# Patient Record
Sex: Male | Born: 1960 | Race: White | Hispanic: No | Marital: Married | Smoking: Former smoker
Health system: Southern US, Community
[De-identification: ages and names within clinical notes are randomized; demographics above are authoritative.]

## PROBLEM LIST (undated history)

## (undated) DIAGNOSIS — I4891 Unspecified atrial fibrillation: Secondary | ICD-10-CM

## (undated) DIAGNOSIS — I700 Atherosclerosis of aorta: Secondary | ICD-10-CM

## (undated) DIAGNOSIS — Z720 Tobacco use: Secondary | ICD-10-CM

## (undated) HISTORY — DX: Tobacco use: Z72.0

## (undated) HISTORY — PX: GANGLION CYST EXCISION: SHX1691

## (undated) HISTORY — DX: Unspecified atrial fibrillation: I48.91

## (undated) HISTORY — DX: Atherosclerosis of aorta: I70.0

---

## 2004-02-10 ENCOUNTER — Emergency Department (HOSPITAL_COMMUNITY): Admission: EM | Admit: 2004-02-10 | Discharge: 2004-02-10 | Payer: Self-pay | Admitting: Emergency Medicine

## 2004-02-11 ENCOUNTER — Other Ambulatory Visit (HOSPITAL_COMMUNITY): Admission: RE | Admit: 2004-02-11 | Discharge: 2004-05-11 | Payer: Self-pay | Admitting: Psychiatry

## 2007-01-07 ENCOUNTER — Observation Stay (HOSPITAL_COMMUNITY): Admission: EM | Admit: 2007-01-07 | Discharge: 2007-01-08 | Payer: Self-pay | Admitting: Emergency Medicine

## 2010-11-15 NOTE — Discharge Summary (Signed)
NAME:  Kristopher [name], MCENROE              ACCOUNT NO.:  [id number]   MEDICAL RECORD NO.:  [id number]          PATIENT TYPE:  INP   LOCATION:  1417                         FACILITY:  [facility]   PHYSICIAN:  Francisca December, M.D.  DATE OF BIRTH:  04/28/1961   DATE OF ADMISSION:  01/07/2007  DATE OF DISCHARGE:                               DISCHARGE SUMMARY   DISCHARGE DIAGNOSES:  1. Chest pain, resolved.  2. Headache, resolved.  3. Family history of coronary artery disease.  4. History of cluster headaches.   Mr. [name] is a 50 year old male patient, who was admitted to [facility] after experiencing right neck, right head pain as well as  well right temporal region headache pain the evening prior to admission.  This happened twice during sexual intercourse.  The head discomfort  stopped.  The next day, he developed substernal chest pain that radiated  into the right neck region.  He came to the emergency room.   During his hospitalization, lab studies showed normal Point of Care  markers.  His CBC and B-Met were unremarkable.  His EKG had nonspecific  ST-T wave changes inferior laterally.   He did have a CT scan of the head that was negative.   After monitoring him overnight, we decided that he was ready for  discharge for home with plans to have a stress Cardiolite performed in  the office.  We will call him at home to arrange this.   He may return tomorrow on January 09, 2007, but he is instructed not to lift  over 10 pounds or take any strenuous activity until after Cardiolite has  been performed.  Our office will call him with an appointment for the  Cardiolite.   In the meantime, his medications are as follows:   1. Enteric-coated aspirin 325 mg a day.  2. Protonix 40 mg a day.  3. Metoprolol ER 25 mg a day.  4. Subinguinal nitroglycerin p.r.n. chest pain.   Patient is discharged to home in stable and improved condition.      Guy Franco, P.A.      Francisca December, M.D.  Electronically Signed    LB/MEDQ  D:  01/08/2007  T:  01/08/2007  Job:  [id number]

## 2010-11-15 NOTE — H&P (Addendum)
NAME:  Kristopher [name], Kristopher [name]              ACCOUNT NO.:  [id number]   MEDICAL RECORD NO.:  [id number]          PATIENT TYPE:  INP   LOCATION:  1417                         FACILITY:  [facility]   PHYSICIAN:  Francisca December, M.D.  DATE OF BIRTH:  1960/11/22   DATE OF ADMISSION:  01/07/2007  DATE OF DISCHARGE:                              HISTORY & PHYSICAL   CHIEF COMPLAINT:  Head, neck and chest pain.   Mr. Kristopher [name] is a 50 year old male patient with an unknown history of  coronary artery disease.  He developed right neck and right head pain  radiating into the right temporal region on the evening prior to  admission during sexual intercourse.  This recurred twice during the  same setting.  The discomfort essentially stopped, but he does have a  history of cluster headaches, which are similar.  On the date of  admission, he went to work and began having substernal chest pain that  radiated into his right neck.  He then came to the emergency room.  He  denies any other constitutional symptoms including palpitations,  dyspnea.   PAST MEDICAL HISTORY:  1. Smoker.  2. Cluster headaches.  3. Alcohol use.  4. Family history of coronary artery disease.   SOCIAL HISTORY:  Smokes 2 packs a day.  No illicit drug use.  Drinks 12-  24 beers weekly.   FAMILY HISTORY:  Mom died of a CVA.  Dad had his first heart attack in  his 61s.   He was on no medications.   He has no allergies.   VITALS:  Blood pressure 144/90, respirations 18, pulse 66, O2 saturation  is 97% on 2 liters.  HEENT:  Grossly normal.  No carotid upstroke ______, JVD, or  thyromegaly.  Clear conjunctivae.  Normal nares without drainage.  CHEST:  Clear to auscultation bilaterally.  No wheezes or rhonchi.  HEART:  Regular rate and rhythm.  No evidence of murmur.  ABDOMEN:  Benign.  ________ bowel sounds.  Nontender, nondistended.  No  masses.  No bruits.  LOWER EXTREMITIES:  No peripheral edema.  Palpable PT pulses  bilaterally.  SKIN:  Warm and dry.  He is alert and oriented x3.  Normal mood and affect.   LAB STUDIES:  Point of care markers negative x3.  Hemoglobin 16.6,  hematocrit 46.9, BUN 5, creatinine 0.77.  Chest x-ray:  Not acute.                                                 EKG:  Normal sinus rhythm.  Rate 74 with nonspecific ST-T wave changes  inferior laterally.   ASSESSMENT/PLAN:  1. Headache, now resolved.  2. Chest pain, pain-free.  3. Family history of coronary artery disease.  4. History of cluster headaches.  Head CT was checked and was normal.      We also will complete cardiac isoenzymes, check a TSH.  If all labs      are normal,  outpatient Cardiolite will be obtained.   Patient was seen and examined by Dr. Corliss Marcus.      Kristopher [name], P.A.      Francisca December, M.D.  Electronically Signed    LB/MEDQ  D:  01/08/2007  T:  01/08/2007  Job:  [id number]

## 2011-04-18 LAB — CBC
HCT: 46.9
Platelets: 348
WBC: 10

## 2011-04-18 LAB — COMPREHENSIVE METABOLIC PANEL
AST: 19
Albumin: 4.4
Alkaline Phosphatase: 79
Chloride: 104
GFR calc Af Amer: >60
Potassium: 4.6
Total Bilirubin: 0.8

## 2011-04-18 LAB — CARDIAC PANEL(CRET KIN+CKTOT+MB+TROPI)
Relative Index: INVALID
Troponin I: 0.01
Troponin I: 0.02

## 2011-04-18 LAB — CK TOTAL AND CKMB (NOT AT ARMC)
CK, MB: 0.9
Total CK: 36

## 2011-04-18 LAB — DIFFERENTIAL
Basophils Absolute: 0
Basophils Relative: 0
Eosinophils Relative: 1
Monocytes Absolute: 0.5

## 2011-04-18 LAB — POCT CARDIAC MARKERS
CKMB, poc: <1 — ABNORMAL LOW
Troponin i, poc: <0.05

## 2011-04-18 LAB — APTT: aPTT: 28

## 2016-07-29 ENCOUNTER — Other Ambulatory Visit: Payer: Self-pay | Admitting: Family

## 2016-07-29 ENCOUNTER — Ambulatory Visit (INDEPENDENT_AMBULATORY_CARE_PROVIDER_SITE_OTHER): Payer: Self-pay | Admitting: Family

## 2016-07-29 VITALS — BP 128/74 | HR 71 | Temp 99.5°F | Ht 71.5 in | Wt 139.4 lb

## 2016-07-29 DIAGNOSIS — Z0000 Encounter for general adult medical examination without abnormal findings: Secondary | ICD-10-CM

## 2016-07-29 DIAGNOSIS — Z1211 Encounter for screening for malignant neoplasm of colon: Secondary | ICD-10-CM

## 2016-07-29 DIAGNOSIS — Z720 Tobacco use: Secondary | ICD-10-CM

## 2016-07-29 DIAGNOSIS — Z125 Encounter for screening for malignant neoplasm of prostate: Secondary | ICD-10-CM

## 2016-07-29 NOTE — Patient Instructions (Signed)
Steps to Quit Smoking Smoking tobacco can be bad for your health. It can also affect almost every organ in your body. Smoking puts you and people around you at risk for many serious long-lasting (chronic) diseases. Quitting smoking is hard, but it is one of the best things that you can do for your health. It is never too late to quit. What are the benefits of quitting smoking? When you quit smoking, you lower your risk for getting serious diseases and conditions. They can include:  Lung cancer or lung disease.  Heart disease.  Stroke.  Heart attack.  Not being able to have children (infertility).  Weak bones (osteoporosis) and broken bones (fractures). If you have coughing, wheezing, and shortness of breath, those symptoms may get better when you quit. You may also get sick less often. If you are pregnant, quitting smoking can help to lower your chances of having a baby of low birth weight. What can I do to help me quit smoking? Talk with your doctor about what can help you quit smoking. Some things you can do (strategies) include:  Quitting smoking totally, instead of slowly cutting back how much you smoke over a period of time.  Going to in-person counseling. You are more likely to quit if you go to many counseling sessions.  Using resources and support systems, such as:  Online chats with a counselor.  Phone quitlines.  Printed self-help materials.  Support groups or group counseling.  Text messaging programs.  Mobile phone apps or applications.  Taking medicines. Some of these medicines may have nicotine in them. If you are pregnant or breastfeeding, do not take any medicines to quit smoking unless your doctor says it is okay. Talk with your doctor about counseling or other things that can help you. Talk with your doctor about using more than one strategy at the same time, such as taking medicines while you are also going to in-person counseling. This can help make quitting  easier. What things can I do to make it easier to quit? Quitting smoking might feel very hard at first, but there is a lot that you can do to make it easier. Take these steps:  Talk to your family and friends. Ask them to support and encourage you.  Call phone quitlines, reach out to support groups, or work with a counselor.  Ask people who smoke to not smoke around you.  Avoid places that make you want (trigger) to smoke, such as:  Bars.  Parties.  Smoke-break areas at work.  Spend time with people who do not smoke.  Lower the stress in your life. Stress can make you want to smoke. Try these things to help your stress:  Getting regular exercise.  Deep-breathing exercises.  Yoga.  Meditating.  Doing a body scan. To do this, close your eyes, focus on one area of your body at a time from head to toe, and notice which parts of your body are tense. Try to relax the muscles in those areas.  Download or buy apps on your mobile phone or tablet that can help you stick to your quit plan. There are many free apps, such as QuitGuide from the CDC (Centers for Disease Control and Prevention). You can find more support from smokefree.gov and other websites. This information is not intended to replace advice given to you by your health care provider. Make sure you discuss any questions you have with your health care provider. Document Released: 04/15/2009 Document Revised: 02/15/2016 Document   Reviewed: 11/03/2014 Elsevier Interactive Patient Education  2017 [employer]vier Inc.  

## 2016-07-29 NOTE — Progress Notes (Signed)
Subjective:     Patient ID: Kristopher [name], male   DOB: [birth year], 56 y.o.   MRN: [id number]  HPI Patient presents for yearly preventative medicine examination. He is a 56 year old white male, 1 ppd smoker. Never had a colonoscopy screen, last CXR 2006, and never had AAA screening.  All immunizations and health maintenance protocols were reviewed with the patient and needed orders were placed.Appropriate screening laboratory values were ordered for the patient including screening of hyperlipidemia, renal function and hepatic function.  PSA was ordered. Medication reconciliation,  past medical history, social history, problem list and allergies were reviewed in detail with the patient  Goals were established with regard to smoking cessation, exercise, and  diet in compliance with medications     Review of Systems  Constitutional: Negative.   HENT: Negative.   Eyes: Negative.   Respiratory: Negative.   Cardiovascular: Negative.   Gastrointestinal: Negative.   Endocrine: Negative.   Genitourinary: Negative.   Musculoskeletal: Negative.   Skin: Negative.   Allergic/Immunologic: Negative.   Neurological: Negative.   Hematological: Negative.   Psychiatric/Behavioral: Negative.    No past medical history on file.  Social History   Social History  . Marital status: Single    Spouse name: N/A  . Number of children: N/A  . Years of education: N/A   Occupational History  . Not on file.   Social History Main Topics  . Smoking status: Current Every Day Smoker    Packs/day: 1.00    Years: 40.00    Types: Cigarettes  . Smokeless tobacco: Never Used  . Alcohol use No  . Drug use: No  . Sexual activity: Not on file   Other Topics Concern  . Not on file   Social History Narrative  . No narrative on file    Past Surgical History:  Procedure Laterality Date  . GANGLION CYST EXCISION      Family History  Problem Relation Age of Onset  . Stroke Mother   . Heart disease  Father   . Heart attack Father   . Diabetes Sister   . Diabetes Brother     No Known Allergies  No current outpatient prescriptions on file prior to visit.   No current facility-administered medications on file prior to visit.     BP 128/74 (BP Location: Right Arm, Patient Position: Sitting, Cuff Size: Normal)   Pulse 71   Temp 99.5 F (37.5 C) (Oral)   Ht 5' 11.5" (1.816 m)   Wt 139 lb 6.4 oz (63.2 kg)   BMI 19.17 kg/m chart    Objective:   Physical Exam  Constitutional: He is oriented to person, place, and time. He appears well-developed and well-nourished.  HENT:  Head: Normocephalic and atraumatic.  Right Ear: External ear normal.  Left Ear: External ear normal.  Nose: Nose normal.  Mouth/Throat: Oropharynx is clear and moist.  Eyes: Conjunctivae and EOM are normal. Pupils are equal, round, and reactive to light.  Neck: Normal range of motion. Neck supple. No thyromegaly present.  Cardiovascular: Normal rate, regular rhythm and normal heart sounds.   Pulmonary/Chest: Effort normal and breath sounds normal.  Abdominal: Soft. Bowel sounds are normal.  Genitourinary: Rectum normal, prostate normal and penis normal.  Musculoskeletal: Normal range of motion.  Neurological: He is alert and oriented to person, place, and time. He has normal reflexes.  Skin: Skin is warm and dry.  Psychiatric: He has a normal mood and affect.  Assessment:     Kristopher [name] was seen today for work physical.  Diagnoses and all orders for this visit:  Encounter for wellness examination -     COMPLETE METABOLIC PANEL WITH GFR -     POCT urinalysis dipstick -     CBC with Differential -     Lipid Profile -     DG Chest 2 View; Future -     VAS US AAA DUPLEX; Future  Tobacco abuse disorder -     Lipid Profile -     DG Chest 2 View; Future -     VAS US AAA DUPLEX; Future  Screening for prostate cancer -     PSA, total and free  Special screening for malignant neoplasms, colon -      Ambulatory referral to Gastroenterology      Plan:     Patient advised to have colonoscopy screening, AAA screen, CXR. Labs will be [facility] Monday. Advised smoking cessation. Exercise 45 min 4 days a week. Call the office with any questions or concerns.

## 2016-07-31 ENCOUNTER — Ambulatory Visit
Admission: RE | Admit: 2016-07-31 | Discharge: 2016-07-31 | Disposition: A | Discharge status: Home / Self Care | Payer: 59 | Source: Ambulatory Visit | Attending: Family | Admitting: Family

## 2016-07-31 DIAGNOSIS — Z720 Tobacco use: Secondary | ICD-10-CM

## 2016-07-31 DIAGNOSIS — Z0000 Encounter for general adult medical examination without abnormal findings: Secondary | ICD-10-CM

## 2016-07-31 LAB — COMPLETE METABOLIC PANEL WITH GFR
ALT: 22 U/L (ref 9–46)
AST: 23 U/L (ref 10–35)
Albumin: 4.7 g/dL (ref 3.6–5.1)
Alkaline Phosphatase: 55 U/L (ref 40–115)
BUN: 6 mg/dL — ABNORMAL LOW (ref 7–25)
CALCIUM: 9.8 mg/dL (ref 8.6–10.3)
CHLORIDE: 103 mmol/L (ref 98–110)
CO2: 27 mmol/L (ref 20–31)
Creat: 0.69 mg/dL — ABNORMAL LOW (ref 0.70–1.33)
Glucose, Bld: 87 mg/dL (ref 65–99)
POTASSIUM: 4.9 mmol/L (ref 3.5–5.3)
Sodium: 139 mmol/L (ref 135–146)
Total Bilirubin: 0.5 mg/dL (ref 0.2–1.2)
Total Protein: 7.2 g/dL (ref 6.1–8.1)

## 2016-08-01 LAB — PSA: PSA: 0.9 ng/mL (ref <=4.0)

## 2016-08-01 LAB — LIPID PANEL
Cholesterol: 215 mg/dL — ABNORMAL HIGH (ref <200)
HDL: 106 mg/dL (ref >40)
LDL CALC: 98 mg/dL (ref <100)
Total CHOL/HDL Ratio: 2 Ratio (ref <5.0)
Triglycerides: 56 mg/dL (ref <150)
VLDL: 11 mg/dL (ref <30)

## 2016-08-30 ENCOUNTER — Encounter: Payer: Self-pay | Admitting: Family

## 2018-07-30 IMAGING — CR DG CHEST 2V
2 series · 2 of 2 positions shown · non-contrast
Comparison: 01/07/2007.

CLINICAL DATA: Wellness exam.

EXAM:
CHEST  2 VIEW

[w chest pa]
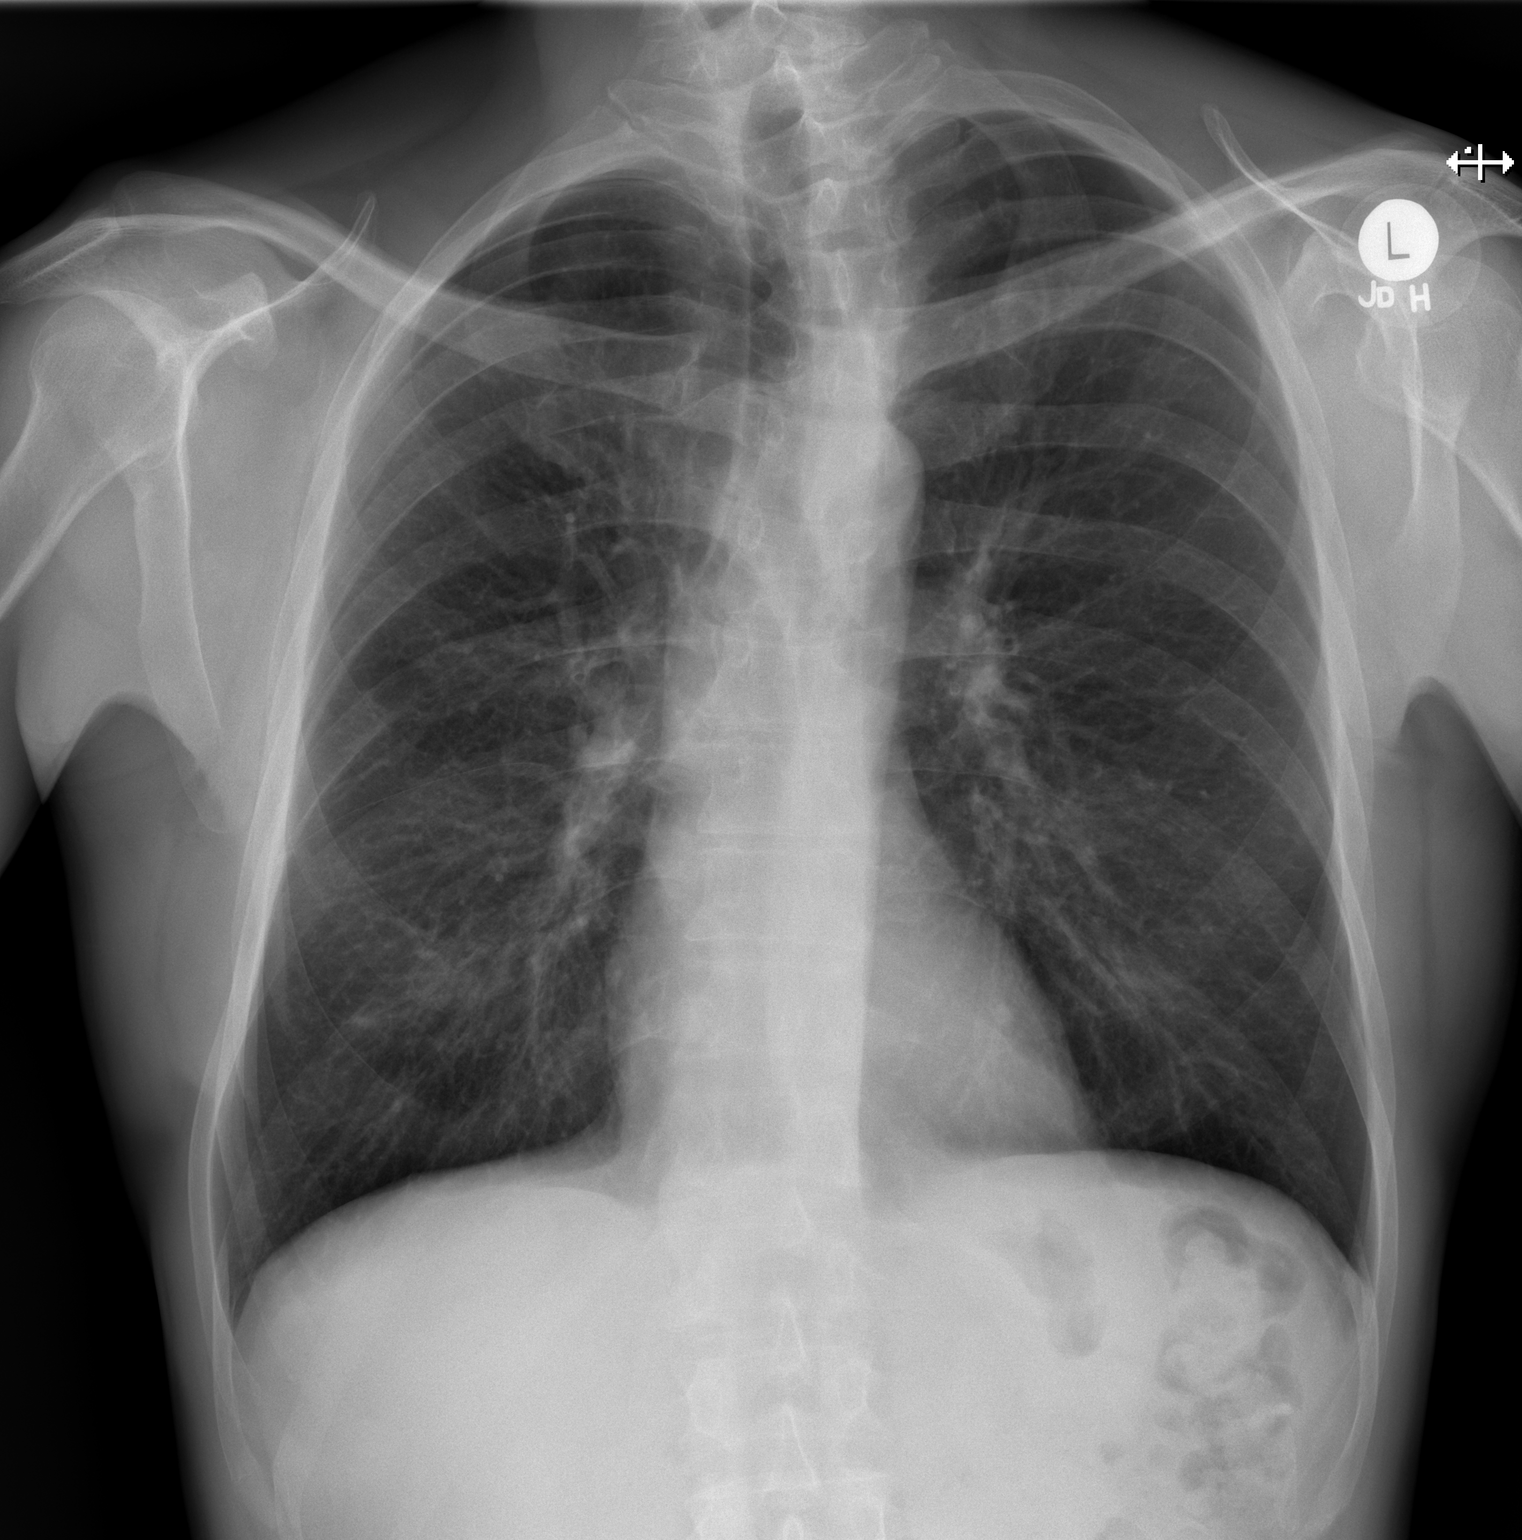

[w chest lat]
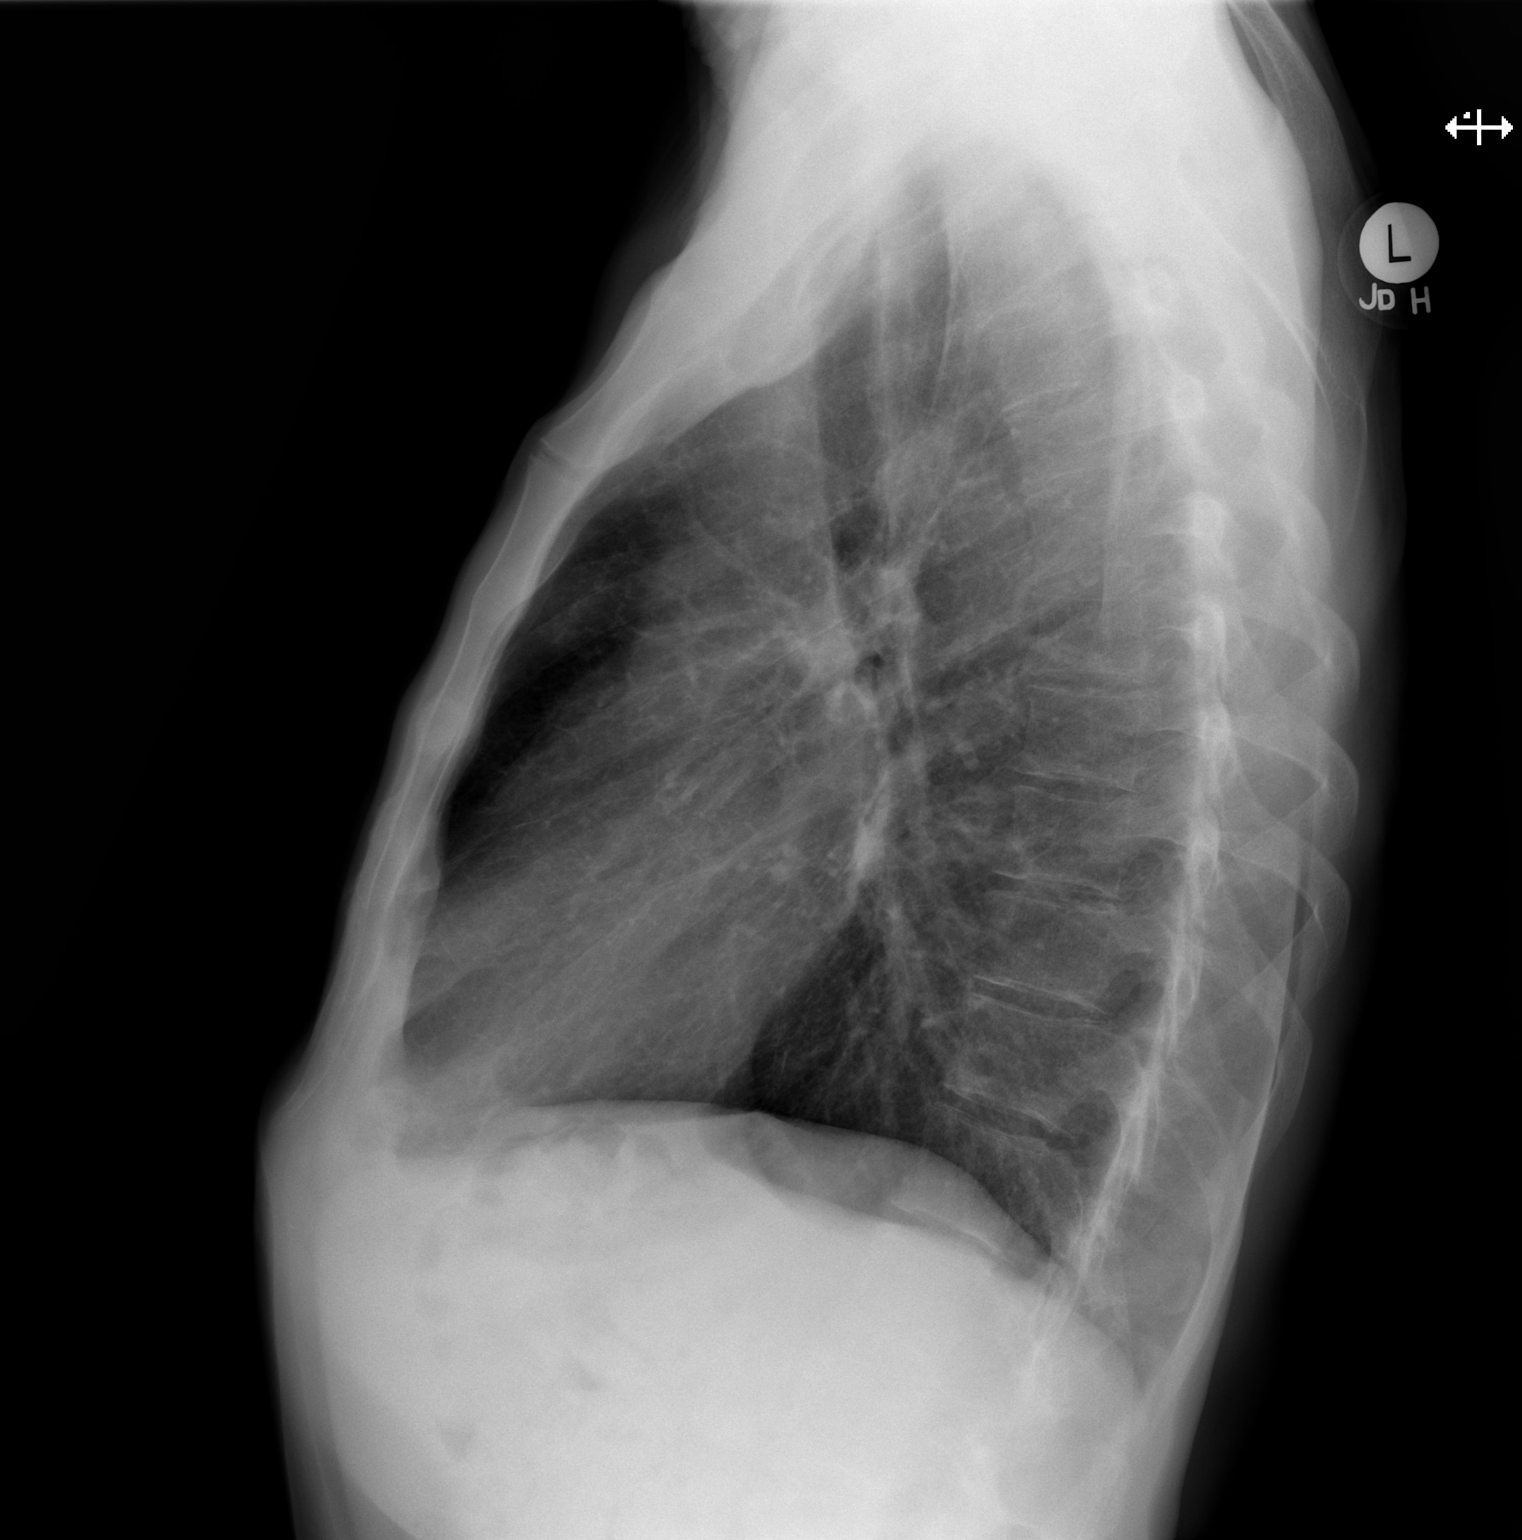

[2 of 2 positions shown; findings below may reference images not displayed]

FINDINGS: Mediastinum hilar structures are unremarkable. Lungs are clear. No
pleural effusion pneumothorax. Stable mild thoracic spine scoliosis.
No acute bony abnormality.
IMPRESSION: No acute cardiopulmonary disease.

## 2023-03-14 ENCOUNTER — Emergency Department (HOSPITAL_COMMUNITY): Payer: 59

## 2023-03-14 ENCOUNTER — Encounter (HOSPITAL_COMMUNITY): Payer: Self-pay

## 2023-03-14 ENCOUNTER — Other Ambulatory Visit: Payer: Self-pay

## 2023-03-14 ENCOUNTER — Inpatient Hospital Stay (HOSPITAL_COMMUNITY)
Admission: EM | Admit: 2023-03-14 | Discharge: 2023-03-19 | DRG: 183 | Disposition: A | Discharge status: Home / Self Care | Payer: 59 | Attending: Internal Medicine | Admitting: Internal Medicine

## 2023-03-14 DIAGNOSIS — S2249XA Multiple fractures of ribs, unspecified side, initial encounter for closed fracture: Secondary | ICD-10-CM | POA: Diagnosis not present

## 2023-03-14 DIAGNOSIS — T797XXA Traumatic subcutaneous emphysema, initial encounter: Secondary | ICD-10-CM | POA: Diagnosis not present

## 2023-03-14 DIAGNOSIS — F17210 Nicotine dependence, cigarettes, uncomplicated: Secondary | ICD-10-CM

## 2023-03-14 DIAGNOSIS — Z9181 History of falling: Secondary | ICD-10-CM | POA: Diagnosis not present

## 2023-03-14 DIAGNOSIS — R9431 Abnormal electrocardiogram [ECG] [EKG]: Secondary | ICD-10-CM

## 2023-03-14 DIAGNOSIS — S2242XK Multiple fractures of ribs, left side, subsequent encounter for fracture with nonunion: Secondary | ICD-10-CM | POA: Diagnosis present

## 2023-03-14 DIAGNOSIS — S79912A Unspecified injury of left hip, initial encounter: Secondary | ICD-10-CM | POA: Diagnosis not present

## 2023-03-14 DIAGNOSIS — S270XXA Traumatic pneumothorax, initial encounter: Secondary | ICD-10-CM | POA: Diagnosis not present

## 2023-03-14 DIAGNOSIS — S2242XA Multiple fractures of ribs, left side, initial encounter for closed fracture: Secondary | ICD-10-CM | POA: Diagnosis not present

## 2023-03-14 DIAGNOSIS — R0602 Shortness of breath: Secondary | ICD-10-CM | POA: Diagnosis not present

## 2023-03-14 DIAGNOSIS — J939 Pneumothorax, unspecified: Principal | ICD-10-CM | POA: Diagnosis present

## 2023-03-14 DIAGNOSIS — D72829 Elevated white blood cell count, unspecified: Secondary | ICD-10-CM | POA: Diagnosis not present

## 2023-03-14 DIAGNOSIS — I480 Paroxysmal atrial fibrillation: Secondary | ICD-10-CM | POA: Diagnosis not present

## 2023-03-14 DIAGNOSIS — M25552 Pain in left hip: Secondary | ICD-10-CM | POA: Diagnosis not present

## 2023-03-14 DIAGNOSIS — I4891 Unspecified atrial fibrillation: Secondary | ICD-10-CM | POA: Diagnosis not present

## 2023-03-14 DIAGNOSIS — I4819 Other persistent atrial fibrillation: Secondary | ICD-10-CM | POA: Diagnosis not present

## 2023-03-14 DIAGNOSIS — W19XXXA Unspecified fall, initial encounter: Secondary | ICD-10-CM

## 2023-03-14 DIAGNOSIS — R935 Abnormal findings on diagnostic imaging of other abdominal regions, including retroperitoneum: Secondary | ICD-10-CM | POA: Diagnosis not present

## 2023-03-14 DIAGNOSIS — Z8249 Family history of ischemic heart disease and other diseases of the circulatory system: Secondary | ICD-10-CM

## 2023-03-14 DIAGNOSIS — S27321A Contusion of lung, unilateral, initial encounter: Secondary | ICD-10-CM | POA: Diagnosis present

## 2023-03-14 DIAGNOSIS — R918 Other nonspecific abnormal finding of lung field: Secondary | ICD-10-CM | POA: Diagnosis not present

## 2023-03-14 DIAGNOSIS — D751 Secondary polycythemia: Secondary | ICD-10-CM | POA: Diagnosis not present

## 2023-03-14 DIAGNOSIS — D7589 Other specified diseases of blood and blood-forming organs: Secondary | ICD-10-CM | POA: Diagnosis not present

## 2023-03-14 DIAGNOSIS — J9601 Acute respiratory failure with hypoxia: Secondary | ICD-10-CM | POA: Diagnosis present

## 2023-03-14 DIAGNOSIS — E7800 Pure hypercholesterolemia, unspecified: Secondary | ICD-10-CM | POA: Diagnosis present

## 2023-03-14 DIAGNOSIS — I700 Atherosclerosis of aorta: Secondary | ICD-10-CM | POA: Diagnosis not present

## 2023-03-14 DIAGNOSIS — W11XXXA Fall on and from ladder, initial encounter: Secondary | ICD-10-CM | POA: Diagnosis present

## 2023-03-14 DIAGNOSIS — Z716 Tobacco abuse counseling: Secondary | ICD-10-CM

## 2023-03-14 DIAGNOSIS — I340 Nonrheumatic mitral (valve) insufficiency: Secondary | ICD-10-CM | POA: Diagnosis not present

## 2023-03-14 DIAGNOSIS — J811 Chronic pulmonary edema: Secondary | ICD-10-CM | POA: Diagnosis not present

## 2023-03-14 DIAGNOSIS — K5730 Diverticulosis of large intestine without perforation or abscess without bleeding: Secondary | ICD-10-CM | POA: Diagnosis not present

## 2023-03-14 DIAGNOSIS — J9811 Atelectasis: Secondary | ICD-10-CM | POA: Diagnosis not present

## 2023-03-14 LAB — I-STAT CHEM 8, ED
BUN: 15 mg/dL (ref 8–23)
Calcium, Ion: 1.2 mmol/L (ref 1.15–1.40)
Chloride: 103 mmol/L (ref 98–111)
Creatinine, Ser: 0.8 mg/dL (ref 0.61–1.24)
Glucose, Bld: 103 mg/dL — ABNORMAL HIGH (ref 70–99)
HCT: 51 % (ref 39.0–52.0)
Hemoglobin: 17.3 g/dL — ABNORMAL HIGH (ref 13.0–17.0)
Potassium: 4.6 mmol/L (ref 3.5–5.1)
Sodium: 138 mmol/L (ref 135–145)
TCO2: 25 mmol/L (ref 22–32)

## 2023-03-14 LAB — COMPREHENSIVE METABOLIC PANEL
ALT: 28 U/L (ref 0–44)
AST: 31 U/L (ref 15–41)
Albumin: 4.7 g/dL (ref 3.5–5.0)
Alkaline Phosphatase: 83 U/L (ref 38–126)
Anion gap: 11 (ref 5–15)
BUN: 15 mg/dL (ref 8–23)
CO2: 25 mmol/L (ref 22–32)
Calcium: 9.7 mg/dL (ref 8.9–10.3)
Chloride: 100 mmol/L (ref 98–111)
Creatinine, Ser: 0.97 mg/dL (ref 0.61–1.24)
GFR, Estimated: >60 mL/min (ref >60)
Glucose, Bld: 109 mg/dL — ABNORMAL HIGH (ref 70–99)
Potassium: 5 mmol/L (ref 3.5–5.1)
Sodium: 136 mmol/L (ref 135–145)
Total Bilirubin: 1 mg/dL (ref 0.3–1.2)
Total Protein: 7.9 g/dL (ref 6.5–8.1)

## 2023-03-14 LAB — CBC
HCT: 52.6 % — ABNORMAL HIGH (ref 39.0–52.0)
Hemoglobin: 18 g/dL — ABNORMAL HIGH (ref 13.0–17.0)
MCH: 34.7 pg — ABNORMAL HIGH (ref 26.0–34.0)
MCHC: 34.2 g/dL (ref 30.0–36.0)
MCV: 101.5 fL — ABNORMAL HIGH (ref 80.0–100.0)
Platelets: 312 10*3/uL (ref 150–400)
RBC: 5.18 MIL/uL (ref 4.22–5.81)
RDW: 11.9 % (ref 11.5–15.5)
WBC: 14.1 10*3/uL — ABNORMAL HIGH (ref 4.0–10.5)
nRBC: 0 % (ref 0.0–0.2)

## 2023-03-14 MED ORDER — SIMETHICONE 40 MG/0.6ML PO SUSP: 80.0000 mg | Freq: Four times a day (QID) | ORAL | Status: DC | PRN

## 2023-03-14 MED ORDER — LACTATED RINGERS IV BOLUS
500.0000 mL | Freq: Once | INTRAVENOUS | Status: AC
  Administered 2023-03-14: 500 mL via INTRAVENOUS

## 2023-03-14 MED ORDER — PSYLLIUM 95 % PO PACK
1.0000 | PACK | Freq: Two times a day (BID) | ORAL | Status: DC
  Administered 2023-03-15 – 2023-03-18 (×7): 1 via ORAL
  Filled 2023-03-14 (×11): qty 1

## 2023-03-14 MED ORDER — ALUM & MAG HYDROXIDE-SIMETH 200-200-20 MG/5ML PO SUSP: 30.0000 mL | Freq: Four times a day (QID) | ORAL | Status: DC | PRN

## 2023-03-14 MED ORDER — HYDROMORPHONE HCL 1 MG/ML IJ SOLN
1.0000 mg | Freq: Once | INTRAMUSCULAR | Status: AC
  Administered 2023-03-14: 1 mg via INTRAVENOUS
  Filled 2023-03-14: qty 1

## 2023-03-14 MED ORDER — ACETAMINOPHEN 500 MG PO TABS
1000.0000 mg | ORAL_TABLET | Freq: Four times a day (QID) | ORAL | Status: DC
  Administered 2023-03-15 – 2023-03-18 (×9): 1000 mg via ORAL
  Filled 2023-03-14 (×14): qty 2

## 2023-03-14 MED ORDER — IOHEXOL 300 MG/ML  SOLN
100.0000 mL | Freq: Once | INTRAMUSCULAR | Status: AC | PRN
  Administered 2023-03-14: 100 mL via INTRAVENOUS

## 2023-03-14 MED ORDER — ALBUTEROL SULFATE (2.5 MG/3ML) 0.083% IN NEBU: 2.5000 mg | INHALATION_SOLUTION | Freq: Four times a day (QID) | RESPIRATORY_TRACT | Status: DC | PRN

## 2023-03-14 MED ORDER — NAPHAZOLINE-GLYCERIN 0.012-0.25 % OP SOLN: 1.0000 [drp] | Freq: Four times a day (QID) | OPHTHALMIC | Status: DC | PRN

## 2023-03-14 MED ORDER — GUAIFENESIN-DM 100-10 MG/5ML PO SYRP: 15.0000 mL | ORAL_SOLUTION | ORAL | Status: DC | PRN

## 2023-03-14 MED ORDER — MORPHINE SULFATE (PF) 4 MG/ML IV SOLN
4.0000 mg | Freq: Once | INTRAVENOUS | Status: AC
  Administered 2023-03-14: 4 mg via INTRAVENOUS
  Filled 2023-03-14: qty 1

## 2023-03-14 MED ORDER — LACTATED RINGERS IV BOLUS: 1000.0000 mL | Freq: Three times a day (TID) | INTRAVENOUS | Status: DC | PRN

## 2023-03-14 MED ORDER — METHOCARBAMOL 500 MG PO TABS: 1000.0000 mg | ORAL_TABLET | Freq: Four times a day (QID) | ORAL | Status: DC | PRN

## 2023-03-14 MED ORDER — HYDROMORPHONE HCL 1 MG/ML IJ SOLN: 0.5000 mg | INTRAMUSCULAR | Status: DC | PRN

## 2023-03-14 MED ORDER — MAGIC MOUTHWASH: 15.0000 mL | Freq: Four times a day (QID) | ORAL | Status: DC | PRN

## 2023-03-14 MED ORDER — SALINE SPRAY 0.65 % NA SOLN: 1.0000 | Freq: Four times a day (QID) | NASAL | Status: DC | PRN

## 2023-03-14 MED ORDER — ONDANSETRON HCL 4 MG/2ML IJ SOLN
4.0000 mg | Freq: Once | INTRAMUSCULAR | Status: AC
  Administered 2023-03-14: 4 mg via INTRAVENOUS
  Filled 2023-03-14: qty 2

## 2023-03-14 MED ORDER — MENTHOL 3 MG MT LOZG: 1.0000 | LOZENGE | OROMUCOSAL | Status: DC | PRN

## 2023-03-14 MED ORDER — LORAZEPAM 2 MG/ML IJ SOLN
0.5000 mg | Freq: Once | INTRAMUSCULAR | Status: AC
  Administered 2023-03-14: 0.5 mg via INTRAVENOUS
  Filled 2023-03-14: qty 1

## 2023-03-14 MED ORDER — METOPROLOL TARTRATE 5 MG/5ML IV SOLN
5.0000 mg | INTRAVENOUS | Status: AC | PRN
  Administered 2023-03-14 (×3): 5 mg via INTRAVENOUS
  Filled 2023-03-14 (×3): qty 5

## 2023-03-14 MED ORDER — LACTATED RINGERS IV BOLUS
1000.0000 mL | Freq: Once | INTRAVENOUS | Status: AC
  Administered 2023-03-14: 1000 mL via INTRAVENOUS

## 2023-03-14 MED ORDER — METHOCARBAMOL 1000 MG/10ML IJ SOLN: 1000.0000 mg | Freq: Four times a day (QID) | INTRAVENOUS | Status: DC | PRN

## 2023-03-14 MED ORDER — SODIUM CHLORIDE 0.9 % IV SOLN: 8.0000 mg | Freq: Four times a day (QID) | INTRAVENOUS | Status: DC | PRN

## 2023-03-14 MED ORDER — PHENOL 1.4 % MT LIQD: 2.0000 | OROMUCOSAL | Status: DC | PRN

## 2023-03-14 MED ORDER — BISACODYL 10 MG RE SUPP: 10.0000 mg | Freq: Two times a day (BID) | RECTAL | Status: DC | PRN

## 2023-03-14 MED ORDER — DILTIAZEM HCL-DEXTROSE 125-5 MG/125ML-% IV SOLN (PREMIX)
5.0000 mg/h | INTRAVENOUS | Status: DC
  Administered 2023-03-14 – 2023-03-16 (×2): 5 mg/h via INTRAVENOUS
  Filled 2023-03-14 (×3): qty 125

## 2023-03-14 MED ORDER — NICOTINE 14 MG/24HR TD PT24
14.0000 mg | MEDICATED_PATCH | Freq: Every day | TRANSDERMAL | Status: DC
  Administered 2023-03-14 – 2023-03-19 (×6): 14 mg via TRANSDERMAL
  Filled 2023-03-14 (×6): qty 1

## 2023-03-14 MED ORDER — ONDANSETRON HCL 4 MG/2ML IJ SOLN: 4.0000 mg | Freq: Four times a day (QID) | INTRAMUSCULAR | Status: DC | PRN

## 2023-03-14 MED ORDER — OXYCODONE HCL 5 MG PO TABS: 5.0000 mg | ORAL_TABLET | ORAL | Status: DC | PRN

## 2023-03-14 NOTE — H&P (Incomplete)
History and Physical    Kristopher [name] XBM:[id number] DOB: [birth year] DOA: 03/14/2023  PCP: Patient, No Pcp Per  Patient coming from: Home  Chief Complaint: Fall  HPI: Kristopher [name] is a 62 y.o. male with medical history significant of cigarette smoking presented to the ED after a fall from a ladder which was approximately 6 feet above the ground.  Patient complained of left-sided rib pain and left hip pain.  Not on anticoagulation.  In the ED, patient found to be in new onset A-fib with RVR with rate initially in the 140s.  Afebrile. Labs showing WBC 14.1, hemoglobin 18.0. CT chest abdomen pelvis showing mildly displaced left seventh, eighth, and ninth rib fractures with small left-sided pneumothorax.  No definite traumatic injury in the abdomen or pelvis. X-ray of left hip/pelvis negative for fracture or dislocation.  Medications administered in the ED include Dilaudid, morphine, Zofran, nicotine patch, IV metoprolol 5 mg, and 1 L LR.  Continued to be in A-fib with rate in the 130s and subsequently started on Cardizem drip.  Oxygen saturation dropped to the high 70s on room air, subsequently placed on 3 L [place].  Trauma surgery consulted and requested admission at [facility] by medicine service.  [facility] called to admit.  Patient states he was at his son's house and fell off from a ladder from approximately 6 feet above the ground.  He reports falling on his left side and injuring his ribs on the left.  Denies head injury or loss of consciousness.  He smokes 1.5 packs of cigarettes daily and has been smoking cigarettes for over 50 years.  Patient states about 10 years ago he was seen by a cardiologist in [place] and was told that he had A-fib.  Patient states he had an echocardiogram done at that time and he was told that it was showing "regurgitation" from a heart valve but it was mild and did not require any further interventions.  States he was never started on a blood thinner or any  other medications for A-fib.  He denies history of any other medical problems.  Denies shortness of breath.  No other complaints.  Review of Systems:  Review of Systems  All other systems reviewed and are negative.   History reviewed. No pertinent past medical history.  Past Surgical History:  Procedure Laterality Date  . GANGLION CYST EXCISION       reports that he has been smoking cigarettes. He has a 40 pack-year smoking history. He has never used smokeless tobacco. He reports that he does not drink alcohol and does not use drugs.  No Known Allergies  Family History  Problem Relation Age of Onset  . Stroke Mother   . Heart disease Father   . Heart attack Father   . Diabetes Sister   . Diabetes Brother     Prior to Admission medications   Medication Sig Start Date End Date Taking? Authorizing Provider  nicotine polacrilex (NICORETTE) 4 MG gum Take 4 mg by mouth as needed for smoking cessation.   Yes [provider]    Physical Exam: Vitals:   03/14/23 2215 03/14/23 2230 03/14/23 2245 03/14/23 2300  BP:  126/86  110/79  Pulse: 95 (!) 52 91 94  Resp:  19 (!) 24 13  Temp:      TempSrc:      SpO2: 90% (!) 79% (!) 86% 97%  Weight:      Height:  Physical Exam Vitals reviewed.  Constitutional:      General: He is not in acute distress. HENT:     Head: Normocephalic and atraumatic.  Eyes:     Extraocular Movements: Extraocular movements intact.  Cardiovascular:     Rate and Rhythm: Normal rate and regular rhythm.     Pulses: Normal pulses.  Pulmonary:     Effort: Pulmonary effort is normal. No respiratory distress.     Breath sounds: Normal breath sounds. No wheezing or rales.  Abdominal:     General: Bowel sounds are normal. There is no distension.     Palpations: Abdomen is soft.     Tenderness: There is no abdominal tenderness. There is no guarding.  Musculoskeletal:     Cervical back: Normal range of motion.     Right lower leg: No edema.      Left lower leg: No edema.  Skin:    General: Skin is warm and dry.  Neurological:     General: No focal deficit present.     Mental Status: He is alert and oriented to person, place, and time.     Labs on Admission: I have personally reviewed following labs and imaging studies  CBC: Recent Labs  Lab 03/14/23 1439 03/14/23 1625  WBC 14.1*  --   HGB 18.0* 17.3*  HCT 52.6* 51.0  MCV 101.5*  --   PLT 312  --    Basic Metabolic Panel: Recent Labs  Lab 03/14/23 1625 03/14/23 1653  NA 138 136  K 4.6 5.0  CL 103 100  CO2  --  25  GLUCOSE 103* 109*  BUN 15 15  CREATININE 0.80 0.97  CALCIUM  --  9.7   GFR: Estimated Creatinine Clearance: 68.5 mL/min (by C-G formula based on SCr of 0.97 mg/dL). Liver Function Tests: Recent Labs  Lab 03/14/23 1653  AST 31  ALT 28  ALKPHOS 83  BILITOT 1.0  PROT 7.9  ALBUMIN 4.7   No results for input(s): "LIPASE", "AMYLASE" in the last 168 hours. No results for input(s): "AMMONIA" in the last 168 hours. Coagulation Profile: No results for input(s): "INR", "PROTIME" in the last 168 hours. Cardiac Enzymes: No results for input(s): "CKTOTAL", "CKMB", "CKMBINDEX", "TROPONINI" in the last 168 hours. BNP (last 3 results) No results for input(s): "PROBNP" in the last 8760 hours. HbA1C: No results for input(s): "HGBA1C" in the last 72 hours. CBG: No results for input(s): "GLUCAP" in the last 168 hours. Lipid Profile: No results for input(s): "CHOL", "HDL", "LDLCALC", "TRIG", "CHOLHDL", "LDLDIRECT" in the last 72 hours. Thyroid Function Tests: No results for input(s): "TSH", "T4TOTAL", "FREET4", "T3FREE", "THYROIDAB" in the last 72 hours. Anemia Panel: No results for input(s): "VITAMINB12", "FOLATE", "FERRITIN", "TIBC", "IRON", "RETICCTPCT" in the last 72 hours. Urine analysis: No results found for: "COLORURINE", "APPEARANCEUR", "LABSPEC", "PHURINE", "GLUCOSEU", "HGBUR", "BILIRUBINUR", "KETONESUR", "PROTEINUR", "UROBILINOGEN",  "NITRITE", "LEUKOCYTESUR"  Radiological Exams on Admission: DG Chest 2 View  Result Date: 03/14/2023 CLINICAL DATA:  Larey Seat off ladder pneumothorax EXAM: CHEST - 2 VIEW COMPARISON:  CT 03/14/2023, chest x-ray 03/14/2023 FINDINGS: Negative for pleural effusion. Small left apical pneumothorax demonstrating about 2.8 cm pleural-parenchymal separation at the apex compared with 1.8 cm on prior radiograph. No midline shift. Acute displaced left seventh and eighth and ninth rib fractures. IMPRESSION: 1. Small left apical pneumothorax, slightly increased in size compared to prior radiograph. 2. Acute displaced left seventh, eighth, and ninth rib fractures. Electronically Signed   By: Jasmine Pang M.D.   On:  03/14/2023 22:17   CT CHEST ABDOMEN PELVIS W CONTRAST  Result Date: 03/14/2023 CLINICAL DATA:  Fall off ladder. EXAM: CT CHEST, ABDOMEN, AND PELVIS WITH CONTRAST TECHNIQUE: Multidetector CT imaging of the chest, abdomen and pelvis was performed following the standard protocol during bolus administration of intravenous contrast. RADIATION DOSE REDUCTION: This exam was performed according to the departmental dose-optimization program which includes automated exposure control, adjustment of the mA and/or kV according to patient size and/or use of iterative reconstruction technique. CONTRAST:  OMNIPAQUE IOHEXOL 300 MG/ML  SOLN COMPARISON:  None Available. FINDINGS: CT CHEST FINDINGS Cardiovascular: No significant vascular findings. Normal heart size. No pericardial effusion. Mediastinum/Nodes: No enlarged mediastinal, hilar, or axillary lymph nodes. Thyroid gland, trachea, and esophagus demonstrate no significant findings. Lungs/Pleura: Small left apical and anterior pneumothorax is noted as described on prior radiograph. No effusion is noted. No consolidation is noted. Musculoskeletal: Mildly displaced fractures are seen involving the lateral portions of the left seventh, eighth and ninth ribs. CT ABDOMEN  PELVIS FINDINGS Hepatobiliary: No cholelithiasis or biliary dilatation is noted. Multiple small calcifications are seen throughout hepatic parenchyma suggesting old inflammation. Pancreas: Unremarkable. No pancreatic ductal dilatation or surrounding inflammatory changes. Spleen: Multiple calcified granulomas are noted. Adrenals/Urinary Tract: Adrenal glands are unremarkable. Kidneys are normal, without renal calculi, focal lesion, or hydronephrosis. Bladder is unremarkable. Stomach/Bowel: Stomach is within normal limits. Appendix appears normal. No evidence of bowel wall thickening, distention, or inflammatory changes. Sigmoid diverticulosis is noted without inflammation. Vascular/Lymphatic: Aortic atherosclerosis. No enlarged abdominal or pelvic lymph nodes. Reproductive: Prostate is unremarkable. Other: No abdominal wall hernia or abnormality. No abdominopelvic ascites. Musculoskeletal: No acute or significant osseous findings. IMPRESSION: Small left-sided pneumothorax is noted as described on prior chest radiograph. Mildly displaced left seventh, eighth and ninth rib fractures. No definite traumatic injury seen in the abdomen or pelvis. Sigmoid diverticulosis without inflammation. Aortic Atherosclerosis (ICD10-I70.0). Electronically Signed   By: Lupita Raider M.D.   On: 03/14/2023 18:38   DG Chest Portable 1 View  Result Date: 03/14/2023 CLINICAL DATA:  Left rib pain after fall off ladder. EXAM: PORTABLE CHEST 1 VIEW COMPARISON:  July 31, 2016. FINDINGS: The heart size and mediastinal contours are within normal limits. Small left apical pneumothorax is noted. Mildly displaced left seventh and eighth rib fractures are noted. No consolidative process is noted. IMPRESSION: Mildly displaced left seventh and eighth rib fractures. Small left apical pneumothorax. Critical Value/emergent results were called by telephone at the time of interpretation on 03/14/2023 at 4:27 pm to provider Dr. Jearld Fenton, who verbally  acknowledged these results. Electronically Signed   By: Lupita Raider M.D.   On: 03/14/2023 16:27   DG Hip Unilat W or Wo Pelvis 2-3 Views Left  Result Date: 03/14/2023 CLINICAL DATA:  Fall off ladder. EXAM: DG HIP (WITH OR WITHOUT PELVIS) 2-3V LEFT COMPARISON:  None Available. FINDINGS: There is no evidence of hip fracture or dislocation. There is no evidence of arthropathy or other focal bone abnormality. IMPRESSION: Negative. Electronically Signed   By: Lupita Raider M.D.   On: 03/14/2023 16:22    EKG: Independently reviewed. A-fib with RVR, QTc 503.  No previous EKG for comparison.  Assessment and Plan  Mildly displaced left seventh, eighth, and ninth rib fractures with small left-sided pneumothorax Hypoxia In the setting of fall from a ladder which was approximately 6 feet above the ground.  Continue pain management with scheduled Tylenol, IV Dilaudid PRN, Oxy IR PRN, and Robaxin PRN. Incentive spirometry to  prevent atelectasis.  Oxygen saturation dropped to the high 80s on room air in the ED, currently satting well on 3 L [place].  Continuous pulse ox.  Repeat chest x-ray in the morning to make sure there is no progression of pneumothorax.  If there is progression of pneumothorax, then may benefit from percutaneous thoracostomy tube placement.  Trauma surgery following, appreciate recommendations.  A-fib with RVR Rate remained elevated despite IV metoprolol 5 mg, subsequently started on Cardizem drip in the ED.  Blood pressure stable.  CHA2DS2-VASc score 0.  Continue IV fluid hydration and pain management.  Echocardiogram ordered.  Check TSH and magnesium level.  QT prolongation Keep K >4 and Mag >2.  Avoid QT prolonging drugs.  Mild leukocytosis No fever or infectious signs or symptoms.  Repeat CBC in a.m.  Erythrocytosis Repeat CBC in a.m. to confirm.  Cigarette smoking NicoDerm patch and continue to counsel to quit.  DVT prophylaxis: SCDs Code Status: Full Code (discussed with  the patient) Consults called: Trauma surgery Level of care: Progressive Care Unit Admission status: It is my clinical opinion that admission to INPATIENT is reasonable and necessary because of the expectation that this patient will require hospital care that crosses at least 2 midnights to treat this condition based on the medical complexity of the problems presented.  Given the aforementioned information, the predictability of an adverse outcome is felt to be significant.  John Giovanni MD [facility]  If 7PM-7AM, please contact night-coverage [email or web address]  03/14/2023, 11:24 PM

## 2023-03-14 NOTE — ED Provider Notes (Signed)
[place] EMERGENCY DEPARTMENT AT [facility] Provider Note   CSN: [id number] Arrival date & time: 03/14/23  1330     History  Chief Complaint  Patient presents with   Kristopher [name] is a 62 y.o. male.   Fall     Patient presents to the ER for evaluation after a fall.  Patient states he was standing on a ladder when he fell proximately 6 feet off the ground onto his left ribs and his left hip.  Patient states he is felt a popping in his left rib area since the fall.  It hurts when he takes a deep breath.  He is not feeling short of breath though.  The pain is severe especially when he moves.  He denies any abdominal pain.  No vomiting.  Patient is not on any anticoagulants.  Home Medications Prior to Admission medications   Not on File      Allergies    Patient has no known allergies.    Review of Systems   Review of Systems  Physical Exam Updated Vital Signs BP (!) 146/108   Pulse 81   Temp 98.6 F (37 C) (Oral)   Resp 18   Ht 1.803 m (5\' 11" )   Wt 62.1 kg   SpO2 94%   BMI 19.11 kg/m  Physical Exam Vitals and nursing note reviewed.  Constitutional:      General: He is not in acute distress.    Appearance: He is well-developed.  HENT:     Head: Normocephalic and atraumatic.     Right Ear: External ear normal.     Left Ear: External ear normal.  Eyes:     General: No scleral icterus.       Right eye: No discharge.        Left eye: No discharge.     Conjunctiva/sclera: Conjunctivae normal.  Neck:     Trachea: No tracheal deviation.  Cardiovascular:     Rate and Rhythm: Normal rate and regular rhythm.  Pulmonary:     Effort: Pulmonary effort is normal. No respiratory distress.     Breath sounds: Normal breath sounds. No stridor. No wheezing or rales.  Chest:     Chest wall: Tenderness and crepitus present. No deformity.     Comments: Crepitus appreciated left chest wall, tender left chest wall Abdominal:     General:  Bowel sounds are normal. There is no distension.     Palpations: Abdomen is soft.     Tenderness: There is no abdominal tenderness. There is no guarding or rebound.  Musculoskeletal:        General: No tenderness or deformity.     Cervical back: Normal and neck supple.     Thoracic back: Normal.     Lumbar back: Normal.     Comments: Mild tenderness palpation left hip  Skin:    General: Skin is warm and dry.     Findings: No rash.  Neurological:     General: No focal deficit present.     Mental Status: He is alert.     Cranial Nerves: No cranial nerve deficit, dysarthria or facial asymmetry.     Sensory: No sensory deficit.     Motor: No abnormal muscle tone or seizure activity.     Coordination: Coordination normal.  Psychiatric:        Mood and Affect: Mood normal.     ED Results /  Procedures / Treatments   Labs (all labs ordered are listed, but only abnormal results are displayed) Labs Reviewed  CBC  I-STAT CHEM 8, ED    EKG None  Radiology No results found.  Procedures Procedures    Medications Ordered in ED Medications  HYDROmorphone (DILAUDID) injection 1 mg (has no administration in time range)  lactated ringers bolus 1,000 mL (has no administration in time range)    ED Course/ Medical Decision Making/ A&P Clinical Course as of 03/16/23 0826  Wed Mar 14, 2023  1626 Pt with small L apical PTX and 7th/8th rib fractures. Will further evaluate with CT chest w contrast [HN]  1847 CT chest w contrast w/ left rib fractures, small PTX. Consulted to trauma. Patient requiring IV analgesia. No hypoxia or tachypnea.  [HN]  1911 On reevaluation, patient seems to have irregular heart rate. Has no h/o Afib that he is aware of . Will obtain EKG. [HN]  1952 Patient w/ EKG demonstrating Afib w/ RVR. 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 No history of this. Will give metoprolol to get rate down, not a candidate for cardioversion  currently. [HN]  2120 Re-paged trauma surgery. D/w nurse who suggested I page Dr. Michaell Cowing instead. [HN]  2127 D/w Dr. Michaell Cowing who recommends admission to [facility] and trauma service will follow him in the AM.  [HN]  2128 Re-paged out hospitalist. [HN]  2240 Repaging hospitalist [HN]  2305 Pt HR 110s-130s still after 3 doses 5 mg IV metoprolol. SBP 110 mmHg. Will give cardizem gtt. Also now satting 97% on 3L [place]. Admitted to medicine for [facility].  [HN]    Clinical Course User Index [HN] [name], MD                                 Medical Decision Making Amount and/or Complexity of Data Reviewed Labs: ordered. Radiology: ordered.  Risk Prescription drug management. Decision regarding hospitalization.   Pt presented to the ED for evaluation after a fall.  No LOC.  Primarily complaining of pain in the left rib area.  Mild ttp left hip.  Crepitus noted on the left rib area.  Lungs CTA.  No resp difficulty.  Concerning for possible rib fx, lung injury without signs of tension ptx.  Imaging studies pending at the time of shift change.  Care turned over to Dr Berenice Primas.        Final Clinical Impression(s) / ED Diagnoses pending    Linwood Dibbles, MD 03/16/23 [phone]

## 2023-03-14 NOTE — ED Triage Notes (Signed)
Pt coming in following a fall from a ladder. Pt states that he was apprx 6 ft off the ground, pt endorses left sided rib pain, pt endorses pain and the feeling of grinding/popping in ribs.

## 2023-03-14 NOTE — ED Provider Notes (Signed)
3:10 PM Assumed care of patient from off-going team. For more details, please see note from same day.  In brief, this is a 62 y.o. male who fell off ladder at home, has left rib pain/left hip. Feels crackling there. Exam demonstrates crepitus on left rib area.  Plan/Dispo at time of sign-out & ED Course since sign-out: [ ]  labs, CT imaging  BP (!) 146/108   Pulse 81   Temp 98.6 F (37 C) (Oral)   Resp 18   Ht 5\' 11"  (1.803 m)   Wt 62.1 kg   SpO2 94%   BMI 19.11 kg/m    ED Course:   Clinical Course as of 03/14/23 2329  Wed Mar 14, 2023  1626 Pt with small L apical PTX and 7th/8th rib fractures. Will further evaluate with CT chest w contrast [HN]  1847 CT chest w contrast w/ left rib fractures, small PTX. Consulted to trauma. Patient requiring IV analgesia. No hypoxia or tachypnea.  [HN]  1911 On reevaluation, patient seems to have irregular heart rate. Has no h/o Afib that he is aware of . Will obtain EKG. [HN]  1952 Patient w/ EKG demonstrating Afib w/ RVR. 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 No history of this. Will give metoprolol to get rate down, not a candidate for cardioversion currently. [HN]  2120 Re-paged trauma surgery. D/w nurse who suggested I page Dr. Michaell Cowing instead. [HN]  2127 D/w Dr. Michaell Cowing who recommends admission to [facility] and trauma service will follow him in the AM.  [HN]  2128 Re-paged out hospitalist. [HN]  2240 Repaging hospitalist [HN]  2305 Pt HR 110s-130s still after 3 doses 5 mg IV metoprolol. SBP 110 mmHg. Will give cardizem gtt. Also now satting 97% on 3L [place]. Admitted to medicine for [facility].  [HN]    Clinical Course User Index [HN] [name], MD    Dispo: Admit to hospitalist on cardizem drip for [place] w/ trauma following.  ------------------------------- Vivi Barrack, MD Emergency Medicine  This note was created using dictation software, which may contain spelling or grammatical  errors.  CRITICAL CARE Performed by: [name] Total critical care time: 50 minutes Critical care time was exclusive of separately billable procedures and treating other patients. Critical care was necessary to treat or prevent imminent or life-threatening deterioration. Critical care was time spent personally by me on the following activities: development of treatment plan with patient and/or surrogate as well as nursing, discussions with consultants, evaluation of patient's response to treatment, examination of patient, obtaining history from patient or surrogate, ordering and performing treatments and interventions, ordering and review of laboratory studies, ordering and review of radiographic studies, pulse oximetry and re-evaluation of patient's condition.     [name], MD 03/14/23 [phone]

## 2023-03-14 NOTE — H&P (Signed)
History and Physical    Kristopher [name] OZH:[id number] DOB: [birth year] DOA: 03/14/2023  PCP: Patient, No Pcp Per  Patient coming from: Home  Chief Complaint: Fall  HPI: Kristopher [name] is a 62 y.o. male with medical history significant of cigarette smoking presented to the ED after a fall from a ladder which was approximately 6 feet above the ground.  Patient complained of left-sided rib pain and left hip pain.  Not on anticoagulation.  In the ED, patient found to be in new onset A-fib with RVR with rate initially in the 140s.  Afebrile. Labs showing WBC 14.1, hemoglobin 18.0. CT chest abdomen pelvis showing mildly displaced left seventh, eighth, and ninth rib fractures with small left-sided pneumothorax.  No definite traumatic injury in the abdomen or pelvis. X-ray of left hip/pelvis negative for fracture or dislocation.  Medications administered in the ED include Dilaudid, morphine, Zofran, nicotine patch, IV metoprolol 5 mg, and 1 L LR.  Continued to be in A-fib with rate in the 130s and subsequently started on Cardizem drip.  Oxygen saturation dropped to the high 70s on room air, subsequently placed on 3 L [place].  Trauma surgery consulted and requested admission at [facility] by medicine service.  [facility] called to admit.  Patient states he was at his son's house and fell off from a ladder from approximately 6 feet above the ground.  He reports falling on his left side and injuring his ribs on the left.  Denies head injury or loss of consciousness.  He smokes 1.5 packs of cigarettes daily and has been smoking cigarettes for over 50 years.  Patient states about 10 years ago he was seen by a cardiologist in [place] and was told that he had A-fib.  Patient states he had an echocardiogram done at that time and he was told that it was showing "regurgitation" from a heart valve but it was mild and did not require any further interventions.  States he was never started on a blood thinner or any  other medications for A-fib.  He denies history of any other medical problems.  Denies shortness of breath.  No other complaints.  Review of Systems:  Review of Systems  All other systems reviewed and are negative.   History reviewed. No pertinent past medical history.  Past Surgical History:  Procedure Laterality Date   GANGLION CYST EXCISION       reports that he has been smoking cigarettes. He has a 40 pack-year smoking history. He has never used smokeless tobacco. He reports that he does not drink alcohol and does not use drugs.  No Known Allergies  Family History  Problem Relation Age of Onset   Stroke Mother    Heart disease Father    Heart attack Father    Diabetes Sister    Diabetes Brother     Prior to Admission medications   Medication Sig Start Date End Date Taking? Authorizing Provider  nicotine polacrilex (NICORETTE) 4 MG gum Take 4 mg by mouth as needed for smoking cessation.   Yes [provider]    Physical Exam: Vitals:   03/14/23 2215 03/14/23 2230 03/14/23 2245 03/14/23 2300  BP:  126/86  110/79  Pulse: 95 (!) 52 91 94  Resp:  19 (!) 24 13  Temp:      TempSrc:      SpO2: 90% (!) 79% (!) 86% 97%  Weight:      Height:  Physical Exam Vitals reviewed.  Constitutional:      General: He is not in acute distress. HENT:     Head: Normocephalic and atraumatic.  Eyes:     Extraocular Movements: Extraocular movements intact.  Cardiovascular:     Rate and Rhythm: Normal rate and regular rhythm.     Pulses: Normal pulses.  Pulmonary:     Effort: Pulmonary effort is normal. No respiratory distress.     Breath sounds: Normal breath sounds. No wheezing or rales.  Abdominal:     General: Bowel sounds are normal. There is no distension.     Palpations: Abdomen is soft.     Tenderness: There is no abdominal tenderness. There is no guarding.  Musculoskeletal:     Cervical back: Normal range of motion.     Right lower leg: No edema.      Left lower leg: No edema.  Skin:    General: Skin is warm and dry.  Neurological:     General: No focal deficit present.     Mental Status: He is alert and oriented to person, place, and time.     Labs on Admission: I have personally reviewed following labs and imaging studies  CBC: Recent Labs  Lab 03/14/23 1439 03/14/23 1625  WBC 14.1*  --   HGB 18.0* 17.3*  HCT 52.6* 51.0  MCV 101.5*  --   PLT 312  --    Basic Metabolic Panel: Recent Labs  Lab 03/14/23 1625 03/14/23 1653  NA 138 136  K 4.6 5.0  CL 103 100  CO2  --  25  GLUCOSE 103* 109*  BUN 15 15  CREATININE 0.80 0.97  CALCIUM  --  9.7   GFR: Estimated Creatinine Clearance: 68.5 mL/min (by C-G formula based on SCr of 0.97 mg/dL). Liver Function Tests: Recent Labs  Lab 03/14/23 1653  AST 31  ALT 28  ALKPHOS 83  BILITOT 1.0  PROT 7.9  ALBUMIN 4.7   No results for input(s): "LIPASE", "AMYLASE" in the last 168 hours. No results for input(s): "AMMONIA" in the last 168 hours. Coagulation Profile: No results for input(s): "INR", "PROTIME" in the last 168 hours. Cardiac Enzymes: No results for input(s): "CKTOTAL", "CKMB", "CKMBINDEX", "TROPONINI" in the last 168 hours. BNP (last 3 results) No results for input(s): "PROBNP" in the last 8760 hours. HbA1C: No results for input(s): "HGBA1C" in the last 72 hours. CBG: No results for input(s): "GLUCAP" in the last 168 hours. Lipid Profile: No results for input(s): "CHOL", "HDL", "LDLCALC", "TRIG", "CHOLHDL", "LDLDIRECT" in the last 72 hours. Thyroid Function Tests: No results for input(s): "TSH", "T4TOTAL", "FREET4", "T3FREE", "THYROIDAB" in the last 72 hours. Anemia Panel: No results for input(s): "VITAMINB12", "FOLATE", "FERRITIN", "TIBC", "IRON", "RETICCTPCT" in the last 72 hours. Urine analysis: No results found for: "COLORURINE", "APPEARANCEUR", "LABSPEC", "PHURINE", "GLUCOSEU", "HGBUR", "BILIRUBINUR", "KETONESUR", "PROTEINUR", "UROBILINOGEN", "NITRITE",  "LEUKOCYTESUR"  Radiological Exams on Admission: DG Chest 2 View  Result Date: 03/14/2023 CLINICAL DATA:  Larey Seat off ladder pneumothorax EXAM: CHEST - 2 VIEW COMPARISON:  CT 03/14/2023, chest x-ray 03/14/2023 FINDINGS: Negative for pleural effusion. Small left apical pneumothorax demonstrating about 2.8 cm pleural-parenchymal separation at the apex compared with 1.8 cm on prior radiograph. No midline shift. Acute displaced left seventh and eighth and ninth rib fractures. IMPRESSION: 1. Small left apical pneumothorax, slightly increased in size compared to prior radiograph. 2. Acute displaced left seventh, eighth, and ninth rib fractures. Electronically Signed   By: Jasmine Pang M.D.   On:  03/14/2023 22:17   CT CHEST ABDOMEN PELVIS W CONTRAST  Result Date: 03/14/2023 CLINICAL DATA:  Fall off ladder. EXAM: CT CHEST, ABDOMEN, AND PELVIS WITH CONTRAST TECHNIQUE: Multidetector CT imaging of the chest, abdomen and pelvis was performed following the standard protocol during bolus administration of intravenous contrast. RADIATION DOSE REDUCTION: This exam was performed according to the departmental dose-optimization program which includes automated exposure control, adjustment of the mA and/or kV according to patient size and/or use of iterative reconstruction technique. CONTRAST:  OMNIPAQUE IOHEXOL 300 MG/ML  SOLN COMPARISON:  None Available. FINDINGS: CT CHEST FINDINGS Cardiovascular: No significant vascular findings. Normal heart size. No pericardial effusion. Mediastinum/Nodes: No enlarged mediastinal, hilar, or axillary lymph nodes. Thyroid gland, trachea, and esophagus demonstrate no significant findings. Lungs/Pleura: Small left apical and anterior pneumothorax is noted as described on prior radiograph. No effusion is noted. No consolidation is noted. Musculoskeletal: Mildly displaced fractures are seen involving the lateral portions of the left seventh, eighth and ninth ribs. CT ABDOMEN PELVIS FINDINGS  Hepatobiliary: No cholelithiasis or biliary dilatation is noted. Multiple small calcifications are seen throughout hepatic parenchyma suggesting old inflammation. Pancreas: Unremarkable. No pancreatic ductal dilatation or surrounding inflammatory changes. Spleen: Multiple calcified granulomas are noted. Adrenals/Urinary Tract: Adrenal glands are unremarkable. Kidneys are normal, without renal calculi, focal lesion, or hydronephrosis. Bladder is unremarkable. Stomach/Bowel: Stomach is within normal limits. Appendix appears normal. No evidence of bowel wall thickening, distention, or inflammatory changes. Sigmoid diverticulosis is noted without inflammation. Vascular/Lymphatic: Aortic atherosclerosis. No enlarged abdominal or pelvic lymph nodes. Reproductive: Prostate is unremarkable. Other: No abdominal wall hernia or abnormality. No abdominopelvic ascites. Musculoskeletal: No acute or significant osseous findings. IMPRESSION: Small left-sided pneumothorax is noted as described on prior chest radiograph. Mildly displaced left seventh, eighth and ninth rib fractures. No definite traumatic injury seen in the abdomen or pelvis. Sigmoid diverticulosis without inflammation. Aortic Atherosclerosis (ICD10-I70.0). Electronically Signed   By: Lupita Raider M.D.   On: 03/14/2023 18:38   DG Chest Portable 1 View  Result Date: 03/14/2023 CLINICAL DATA:  Left rib pain after fall off ladder. EXAM: PORTABLE CHEST 1 VIEW COMPARISON:  July 31, 2016. FINDINGS: The heart size and mediastinal contours are within normal limits. Small left apical pneumothorax is noted. Mildly displaced left seventh and eighth rib fractures are noted. No consolidative process is noted. IMPRESSION: Mildly displaced left seventh and eighth rib fractures. Small left apical pneumothorax. Critical Value/emergent results were called by telephone at the time of interpretation on 03/14/2023 at 4:27 pm to provider Dr. Jearld Fenton, who verbally acknowledged these  results. Electronically Signed   By: Lupita Raider M.D.   On: 03/14/2023 16:27   DG Hip Unilat W or Wo Pelvis 2-3 Views Left  Result Date: 03/14/2023 CLINICAL DATA:  Fall off ladder. EXAM: DG HIP (WITH OR WITHOUT PELVIS) 2-3V LEFT COMPARISON:  None Available. FINDINGS: There is no evidence of hip fracture or dislocation. There is no evidence of arthropathy or other focal bone abnormality. IMPRESSION: Negative. Electronically Signed   By: Lupita Raider M.D.   On: 03/14/2023 16:22    EKG: Independently reviewed. A-fib with RVR, QTc 503.  No previous EKG for comparison.  Assessment and Plan  Mildly displaced left seventh, eighth, and ninth rib fractures with small left-sided pneumothorax Hypoxia In the setting of fall from a ladder which was approximately 6 feet above the ground.  Continue pain management with scheduled Tylenol, IV Dilaudid PRN, Oxy IR PRN, and Robaxin PRN. Incentive spirometry to  prevent atelectasis.  Oxygen saturation dropped to the high 70s on room air in the ED, currently satting well on 3 L [place].  Continuous pulse ox.  Repeat chest x-ray in the morning to make sure there is no progression of pneumothorax.  If there is progression of pneumothorax, then may benefit from percutaneous thoracostomy tube placement.  Trauma surgery following, appreciate recommendations.  A-fib with RVR Patient does report being told that he had A-fib 10 years ago and reportedly had an echocardiogram done at that time which was showing mild valvular abnormality/"regurgitation" although unknown which valve.  Rate remained elevated in the ED despite IV metoprolol 5 mg, subsequently started on Cardizem drip.  Blood pressure stable.  CHA2DS2-VASc score 0.  Continue IV fluid hydration and pain management.  Echocardiogram ordered.  Check TSH and magnesium level.  Continue Cardizem drip.  QT prolongation Keep K >4 and Mag >2.  Avoid QT prolonging drugs.  Mild leukocytosis Possibly reactive.  No fever or  infectious signs or symptoms.  Repeat CBC in a.m.  Erythrocytosis Repeat CBC in a.m. to confirm.  Cigarette smoking NicoDerm patch and continue to counsel to quit.  DVT prophylaxis: SCDs Code Status: Full Code (discussed with the patient) Consults called: Trauma surgery Level of care: Progressive Care Unit Admission status: It is my clinical opinion that admission to INPATIENT is reasonable and necessary because of the expectation that this patient will require hospital care that crosses at least 2 midnights to treat this condition based on the medical complexity of the problems presented.  Given the aforementioned information, the predictability of an adverse outcome is felt to be significant.  John Giovanni MD [facility]  If 7PM-7AM, please contact night-coverage [email or web address]  03/14/2023, 11:24 PM

## 2023-03-14 NOTE — Consult Note (Signed)
Kristopher [name]  08-27-1960 [id number]  CARE TEAM:  PCP: Patient, No Pcp Per  Outpatient Care Team: Patient Care Team: Patient, No Pcp Per as PCP - General (General Practice)  Inpatient Treatment Team: Treatment Team:  [name], MD Cammy Brochure, NT Fevrier, Nile Dear, RN Arlean Hopping, MD Md, Trauma, MD   This patient is a 62 y.o.male who presents today for surgical evaluation at the request of Dr Jearld Fenton  Chief complaint / Reason for evaluation:  Fallup with rib fractures and pneumothorax.   62 year old male former heavy smoker.  He was on a ladder and fell about 6 feet on his left side.  Felt a pop on his left rib cage.  Pain especially with deep breaths.  Persistent issue.  Came the emergency department today at 1:30 PM.  No hemodynamic nor respiratory decline.  Workup shows evidence of rib fractures on the left side x 3.  Small apical pneumothorax.  Challenge for pain controlled especially with deep breaths and twisting.  Patient with irregular and occasionally rapid heart rate.  EKG concerning for atrial fibrillation.   Assessment  Kristopher [name]  62 y.o. male       Problem List:  Principal Problem:   Pneumothorax, left Active Problems:   Fall   Closed fracture of three ribs, left   Atrial fibrillation (HCC)   Fall with rib fractures small stable pneumothorax and new onset atrial fibrillation  Plan:  Medical admission.  Pain control.  Standing Tylenol with ice and heat.  Narcotics and muscle relaxants.  May need to adjust depending on response  Oxygenation.  To at least 2 L overnight.  Work to keep sats above 92%  Rate control and management of atrial fibrillation according to primary service.  Need for anticoagulation should be okay since there is no evidence of any major bleeding at this time but would follow hemoglobins.  Chest x-ray in the morning to make sure there is no progression of pneumothorax.  If there is, may benefit  from percutaneous thoracostomy tube placement.  Hopefully not too likely  Trauma Surgery available to help follow in consultation    I reviewed nursing notes, ED provider notes, last 24 h vitals and pain scores, last 48 h intake and output, last 24 h labs and trends, and last 24 h imaging results. I have reviewed this patient's available data, including medical history, events of note, test results, etc as part of my evaluation.  A significant portion of that time was spent in counseling.  Care during the described time interval was provided by me.  This care required moderate level of medical decision making.  03/14/2023  Ardeth Sportsman, MD, FACS, MASCRS Esophageal, Gastrointestinal & Colorectal Surgery Robotic and Minimally Invasive Surgery  [facility] [address] [place], [place] [address] [phone] Fax [phone] Main  CONTACT INFORMATION: Weekday (9AM-5PM): Call [facility] main office at [phone] Weeknight (5PM-9AM) or Weekend/Holiday: Check EPIC "Web Links" tab & use "AMION" (password " [email or web address]") for General Surgery [facility] coverage  Please, DO NOT use SecureChat  (it is not reliable communication to reach operating surgeons & will lead to a delay in care).   Epic staff messaging available for outptient concerns needing 1-2 business day response.      03/14/2023      History reviewed. No pertinent past medical history.  Past Surgical History:  Procedure Laterality Date   GANGLION CYST  EXCISION      Social History   Socioeconomic History   Marital status: Single    Spouse name: Not on file   Number of children: Not on file   Years of education: Not on file   Highest education level: Not on file  Occupational History   Not on file  Tobacco Use   Smoking status: Every Day    Current packs/day: 1.00    Average packs/day: 1 pack/day for 40.0 years (40.0 ttl pk-yrs)    Types: Cigarettes    Smokeless tobacco: Never  Substance and Sexual Activity   Alcohol use: No   Drug use: No   Sexual activity: Not on file  Other Topics Concern   Not on file  Social History Narrative   Not on file   Social Determinants of Health   Financial Resource Strain: Not on file  Food Insecurity: Not on file  Transportation Needs: Not on file  Physical Activity: Not on file  Stress: Not on file  Social Connections: Not on file  Intimate Partner Violence: Not on file    Family History  Problem Relation Age of Onset   Stroke Mother    Heart disease Father    Heart attack Father    Diabetes Sister    Diabetes Brother     Current Facility-Administered Medications  Medication Dose Route Frequency Provider Last Rate Last Admin   [START ON 03/15/2023] acetaminophen (TYLENOL) tablet 1,000 mg  1,000 mg Oral Q6H Karie Soda, MD       albuterol (PROVENTIL) (2.5 MG/3ML) 0.083% nebulizer solution 2.5 mg  2.5 mg Nebulization Q6H PRN Karie Soda, MD       alum & mag hydroxide-simeth (MAALOX/MYLANTA) 200-200-20 MG/5ML suspension 30 mL  30 mL Oral Q6H PRN Karie Soda, MD       bisacodyl (DULCOLAX) suppository 10 mg  10 mg Rectal Q12H PRN Karie Soda, MD       guaiFENesin-dextromethorphan (ROBITUSSIN DM) 100-10 MG/5ML syrup 15 mL  15 mL Oral Q4H PRN Karie Soda, MD       HYDROmorphone (DILAUDID) injection 0.5-2 mg  0.5-2 mg Intravenous Q2H PRN Karie Soda, MD       lactated ringers bolus 1,000 mL  1,000 mL Intravenous Q8H PRN Karie Soda, MD       magic mouthwash  15 mL Oral QID PRN Karie Soda, MD       menthol-cetylpyridinium (CEPACOL) lozenge 3 mg  1 lozenge Oral PRN Karie Soda, MD       methocarbamol (ROBAXIN) 1,000 mg in dextrose 5 % 100 mL IVPB  1,000 mg Intravenous Q6H PRN Karie Soda, MD       methocarbamol (ROBAXIN) tablet 1,000 mg  1,000 mg Oral Q6H PRN Karie Soda, MD       naphazoline-glycerin (CLEAR EYES REDNESS) ophth solution 1-2 drop  1-2 drop Both Eyes QID PRN  Karie Soda, MD       nicotine (NICODERM CQ - dosed in mg/24 hours) patch 14 mg  14 mg Transdermal Daily [name], MD   14 mg at 03/14/23 1935   ondansetron (ZOFRAN) injection 4 mg  4 mg Intravenous Q6H PRN Karie Soda, MD       Or   ondansetron (ZOFRAN) 8 mg in sodium chloride 0.9 % 50 mL IVPB  8 mg Intravenous Q6H PRN Karie Soda, MD       oxyCODONE (Oxy IR/ROXICODONE) immediate release tablet 5-10 mg  5-10 mg Oral Q4H PRN Karie Soda, MD  phenol (CHLORASEPTIC) mouth spray 2 spray  2 spray Mouth/Throat PRN Karie Soda, MD       psyllium (HYDROCIL/METAMUCIL) 1 packet  1 packet Oral BID Karie Soda, MD       simethicone (MYLICON) 40 MG/0.6ML suspension 80 mg  80 mg Oral QID PRN Karie Soda, MD       sodium chloride (OCEAN) 0.65 % nasal spray 1-2 spray  1-2 spray Each Nare Q6H PRN Karie Soda, MD       Current Outpatient Medications  Medication Sig Dispense Refill   nicotine polacrilex (NICORETTE) 4 MG gum Take 4 mg by mouth as needed for smoking cessation.       No Known Allergies   BP 129/78   Pulse (!) 103   Temp 98 F (36.7 C) (Oral)   Resp 18   Ht 5\' 11"  (1.803 m)   Wt 62.1 kg   SpO2 90%   BMI 19.11 kg/m     Results:   Labs: Results for orders placed or performed during the hospital encounter of 03/14/23 (from the past 48 hour(s))  CBC     Status: Abnormal   Collection Time: 03/14/23  2:39 PM  Result Value Ref Range   WBC 14.1 (H) 4.0 - 10.5 K/uL   RBC 5.18 4.22 - 5.81 MIL/uL   Hemoglobin 18.0 (H) 13.0 - 17.0 g/dL   HCT 96.2 (H) 95.2 - 84.1 %   MCV 101.5 (H) 80.0 - 100.0 fL   MCH 34.7 (H) 26.0 - 34.0 pg   MCHC 34.2 30.0 - 36.0 g/dL   RDW 32.4 40.1 - 02.7 %   Platelets 312 150 - 400 K/uL   nRBC 0.0 0.0 - 0.2 %    Comment: Performed at [facility], [address], [place], [place] [address]  I-stat chem 8, ED (not at [facility], [facility] or [facility])     Status: Abnormal   Collection Time: 03/14/23  4:25 PM  Result Value Ref Range    Sodium 138 135 - 145 mmol/L   Potassium 4.6 3.5 - 5.1 mmol/L   Chloride 103 98 - 111 mmol/L   BUN 15 8 - 23 mg/dL   Creatinine, Ser 4.40 0.61 - 1.24 mg/dL   Glucose, Bld 347 (H) 70 - 99 mg/dL    Comment: Glucose reference range applies only to samples taken after fasting for at least 8 hours.   Calcium, Ion 1.20 1.15 - 1.40 mmol/L   TCO2 25 22 - 32 mmol/L   Hemoglobin 17.3 (H) 13.0 - 17.0 g/dL   HCT 42.5 95.6 - 38.7 %  Comprehensive metabolic panel     Status: Abnormal   Collection Time: 03/14/23  4:53 PM  Result Value Ref Range   Sodium 136 135 - 145 mmol/L   Potassium 5.0 3.5 - 5.1 mmol/L   Chloride 100 98 - 111 mmol/L   CO2 25 22 - 32 mmol/L   Glucose, Bld 109 (H) 70 - 99 mg/dL    Comment: Glucose reference range applies only to samples taken after fasting for at least 8 hours.   BUN 15 8 - 23 mg/dL   Creatinine, Ser 5.64 0.61 - 1.24 mg/dL   Calcium 9.7 8.9 - 33.2 mg/dL   Total Protein 7.9 6.5 - 8.1 g/dL   Albumin 4.7 3.5 - 5.0 g/dL   AST 31 15 - 41 U/L   ALT 28 0 - 44 U/L   Alkaline Phosphatase 83 38 - 126 U/L   Total Bilirubin  1.0 0.3 - 1.2 mg/dL   GFR, Estimated >36 >64 mL/min    Comment: (NOTE) Calculated using the CKD-EPI Creatinine Equation (2021)    Anion gap 11 5 - 15    Comment: Performed at [facility], [address] [place], [place] [address]    Imaging / Studies: CT CHEST ABDOMEN PELVIS W CONTRAST  Result Date: 03/14/2023 CLINICAL DATA:  Fall off ladder. EXAM: CT CHEST, ABDOMEN, AND PELVIS WITH CONTRAST TECHNIQUE: Multidetector CT imaging of the chest, abdomen and pelvis was performed following the standard protocol during bolus administration of intravenous contrast. RADIATION DOSE REDUCTION: This exam was performed according to the departmental dose-optimization program which includes automated exposure control, adjustment of the mA and/or kV according to patient size and/or use of iterative reconstruction technique. CONTRAST:   OMNIPAQUE IOHEXOL 300 MG/ML  SOLN COMPARISON:  None Available. FINDINGS: CT CHEST FINDINGS Cardiovascular: No significant vascular findings. Normal heart size. No pericardial effusion. Mediastinum/Nodes: No enlarged mediastinal, hilar, or axillary lymph nodes. Thyroid gland, trachea, and esophagus demonstrate no significant findings. Lungs/Pleura: Small left apical and anterior pneumothorax is noted as described on prior radiograph. No effusion is noted. No consolidation is noted. Musculoskeletal: Mildly displaced fractures are seen involving the lateral portions of the left seventh, eighth and ninth ribs. CT ABDOMEN PELVIS FINDINGS Hepatobiliary: No cholelithiasis or biliary dilatation is noted. Multiple small calcifications are seen throughout hepatic parenchyma suggesting old inflammation. Pancreas: Unremarkable. No pancreatic ductal dilatation or surrounding inflammatory changes. Spleen: Multiple calcified granulomas are noted. Adrenals/Urinary Tract: Adrenal glands are unremarkable. Kidneys are normal, without renal calculi, focal lesion, or hydronephrosis. Bladder is unremarkable. Stomach/Bowel: Stomach is within normal limits. Appendix appears normal. No evidence of bowel wall thickening, distention, or inflammatory changes. Sigmoid diverticulosis is noted without inflammation. Vascular/Lymphatic: Aortic atherosclerosis. No enlarged abdominal or pelvic lymph nodes. Reproductive: Prostate is unremarkable. Other: No abdominal wall hernia or abnormality. No abdominopelvic ascites. Musculoskeletal: No acute or significant osseous findings. IMPRESSION: Small left-sided pneumothorax is noted as described on prior chest radiograph. Mildly displaced left seventh, eighth and ninth rib fractures. No definite traumatic injury seen in the abdomen or pelvis. Sigmoid diverticulosis without inflammation. Aortic Atherosclerosis (ICD10-I70.0). Electronically Signed   By: Lupita Raider M.D.   On: 03/14/2023 18:38   DG  Chest Portable 1 View  Result Date: 03/14/2023 CLINICAL DATA:  Left rib pain after fall off ladder. EXAM: PORTABLE CHEST 1 VIEW COMPARISON:  July 31, 2016. FINDINGS: The heart size and mediastinal contours are within normal limits. Small left apical pneumothorax is noted. Mildly displaced left seventh and eighth rib fractures are noted. No consolidative process is noted. IMPRESSION: Mildly displaced left seventh and eighth rib fractures. Small left apical pneumothorax. Critical Value/emergent results were called by telephone at the time of interpretation on 03/14/2023 at 4:27 pm to provider Dr. Jearld Fenton, who verbally acknowledged these results. Electronically Signed   By: Lupita Raider M.D.   On: 03/14/2023 16:27   DG Hip Unilat W or Wo Pelvis 2-3 Views Left  Result Date: 03/14/2023 CLINICAL DATA:  Fall off ladder. EXAM: DG HIP (WITH OR WITHOUT PELVIS) 2-3V LEFT COMPARISON:  None Available. FINDINGS: There is no evidence of hip fracture or dislocation. There is no evidence of arthropathy or other focal bone abnormality. IMPRESSION: Negative. Electronically Signed   By: Lupita Raider M.D.   On: 03/14/2023 16:22    Medications / Allergies: per chart  Antibiotics: Anti-infectives (From admission, onward)    None  Note: Portions of this report may have been transcribed using voice recognition software. Every effort was made to ensure accuracy; however, inadvertent computerized transcription errors may be present.   Any transcriptional errors that result from this process are unintentional.    Ardeth Sportsman, MD, FACS, MASCRS Esophageal, Gastrointestinal & Colorectal Surgery Robotic and Minimally Invasive Surgery  [facility] [address] [place], [place] [address] [phone] Fax [phone] Main  CONTACT INFORMATION: Weekday (9AM-5PM): Call [facility] main office at [phone] Weeknight (5PM-9AM) or  Weekend/Holiday: Check EPIC "Web Links" tab & use "AMION" (password " [email or web address]") for General Surgery [facility] coverage  Please, DO NOT use SecureChat  (it is not reliable communication to reach operating surgeons & will lead to a delay in care).   Epic staff messaging available for outptient concerns needing 1-2 business day response.       03/14/2023  10:06 PM

## 2023-03-15 ENCOUNTER — Inpatient Hospital Stay (HOSPITAL_COMMUNITY): Payer: 59

## 2023-03-15 ENCOUNTER — Other Ambulatory Visit (HOSPITAL_COMMUNITY): Payer: 59

## 2023-03-15 DIAGNOSIS — I4891 Unspecified atrial fibrillation: Secondary | ICD-10-CM

## 2023-03-15 DIAGNOSIS — S2242XK Multiple fractures of ribs, left side, subsequent encounter for fracture with nonunion: Secondary | ICD-10-CM

## 2023-03-15 DIAGNOSIS — D7589 Other specified diseases of blood and blood-forming organs: Secondary | ICD-10-CM

## 2023-03-15 DIAGNOSIS — J939 Pneumothorax, unspecified: Secondary | ICD-10-CM | POA: Diagnosis not present

## 2023-03-15 DIAGNOSIS — D751 Secondary polycythemia: Secondary | ICD-10-CM

## 2023-03-15 DIAGNOSIS — R9431 Abnormal electrocardiogram [ECG] [EKG]: Secondary | ICD-10-CM

## 2023-03-15 DIAGNOSIS — D72829 Elevated white blood cell count, unspecified: Secondary | ICD-10-CM

## 2023-03-15 LAB — ECHOCARDIOGRAM COMPLETE
AR max vel: 2.48 cm2
AV Peak grad: 6.7 mmHg
Ao pk vel: 1.29 m/s
Area-P 1/2: 5.31 cm2
Height: 71 in
MV M vel: 4.29 m/s
MV Peak grad: 73.6 mmHg
S' Lateral: 3.1 cm
Weight: 2192 [oz_av]

## 2023-03-15 LAB — CBC
HCT: 45.4 % (ref 39.0–52.0)
Hemoglobin: 15.1 g/dL (ref 13.0–17.0)
MCH: 35.4 pg — ABNORMAL HIGH (ref 26.0–34.0)
MCHC: 33.3 g/dL (ref 30.0–36.0)
MCV: 106.3 fL — ABNORMAL HIGH (ref 80.0–100.0)
Platelets: 268 10*3/uL (ref 150–400)
RBC: 4.27 MIL/uL (ref 4.22–5.81)
RDW: 12.3 % (ref 11.5–15.5)
WBC: 14.1 10*3/uL — ABNORMAL HIGH (ref 4.0–10.5)
nRBC: 0 % (ref 0.0–0.2)

## 2023-03-15 LAB — BASIC METABOLIC PANEL
Anion gap: 6 (ref 5–15)
BUN: 15 mg/dL (ref 8–23)
CO2: 25 mmol/L (ref 22–32)
Calcium: 9.1 mg/dL (ref 8.9–10.3)
Chloride: 102 mmol/L (ref 98–111)
Creatinine, Ser: 0.69 mg/dL (ref 0.61–1.24)
GFR, Estimated: >60 mL/min (ref >60)
Glucose, Bld: 129 mg/dL — ABNORMAL HIGH (ref 70–99)
Potassium: 4.6 mmol/L (ref 3.5–5.1)
Sodium: 133 mmol/L — ABNORMAL LOW (ref 135–145)

## 2023-03-15 LAB — HIV ANTIBODY (ROUTINE TESTING W REFLEX): HIV Screen 4th Generation wRfx: NONREACTIVE

## 2023-03-15 LAB — TSH: TSH: 1.47 u[IU]/mL (ref 0.350–4.500)

## 2023-03-15 LAB — MAGNESIUM: Magnesium: 2.1 mg/dL (ref 1.7–2.4)

## 2023-03-15 MED ORDER — IBUPROFEN 400 MG PO TABS
800.0000 mg | ORAL_TABLET | Freq: Three times a day (TID) | ORAL | Status: DC
  Administered 2023-03-15 – 2023-03-18 (×12): 800 mg via ORAL
  Filled 2023-03-15 (×6): qty 2
  Filled 2023-03-15: qty 1
  Filled 2023-03-15 (×6): qty 2

## 2023-03-15 MED ORDER — NALOXONE HCL 0.4 MG/ML IJ SOLN: 0.4000 mg | INTRAMUSCULAR | Status: DC | PRN

## 2023-03-15 MED ORDER — SODIUM CHLORIDE 0.9 % IV SOLN: INTRAVENOUS | Status: DC

## 2023-03-15 MED ORDER — GABAPENTIN 300 MG PO CAPS
300.0000 mg | ORAL_CAPSULE | Freq: Three times a day (TID) | ORAL | Status: DC
  Administered 2023-03-15 – 2023-03-19 (×13): 300 mg via ORAL
  Filled 2023-03-15 (×13): qty 1

## 2023-03-15 NOTE — Assessment & Plan Note (Signed)
-   QTc 502 - Trend potassium and magnesium

## 2023-03-15 NOTE — Assessment & Plan Note (Signed)
-   Suspected reactive from trauma - Continue trending

## 2023-03-15 NOTE — Progress Notes (Signed)
Progress Note    Tahjee Monday Renwick   JYN:[id number]  DOB: 06-Sep-1960  DOA: 03/14/2023     1 PCP: Patient, No Pcp Per  Initial CC: fall from ladder  Hospital Course: Mr. Knisely is a 62 yo male with PMH known afib and ongoing tobacco use who presented to the hospital after falling off of a ladder from approximately 6 feet off the ground.  He landed on his left side and a wooden board. He does not take any medications for A-fib including rate control medications nor anticoagulation.  He was noted to be in A-fib with RVR on evaluation in the ER. He was started on a Cardizem drip for rate control. He underwent further imaging and was found to have a small left-sided pneumothorax and mildly displaced rib fractures involving the left 7th - 9th ribs.  He is transferred to [facility] for further evaluation with the trauma team.   Interval History:  Resting comfortably when seen in the ER this morning.  Still having expected pain along the left posterior chest wall from rib fractures. No CP, SOB, or palpitations.  On room air.   Assessment and Plan: * Pneumothorax, left - s/p fall from ladder approx 6 ft off ground; stable but persistent small volume left apical PNX per imaging - trauma team following for surveillance and management   - repeat CXR in am  Closed fracture of three ribs, left - Mildly displaced left-sided rib fractures involving 7th - 9th ribs - encourage IS - continue pain control  - ambulate as able  Atrial fibrillation with rapid ventricular response (HCC) - known history; patient states no meds at home for treatment - TSH normal, 1.470 - CHA2DS2-VASc = 0 points. Technically no medication indicated at this time either; would be appropriate for asa at 62 yo - wean cardizem drip off; suspect RVR triggered by trauma/stress  Macrocytosis without anemia -check folate and B12  Leukocytosis - Suspected reactive from trauma - Continue trending  QT prolongation - QTc 502 -  Trend potassium and magnesium  Erythrocytosis-resolved as of 03/15/2023 - hemoconcentrated - normalized    Old records reviewed in assessment of this patient  Antimicrobials:   DVT prophylaxis:  SCDs Start: 03/15/23 0005   Code Status:   Code Status: Full Code  Mobility Assessment (Last 72 Hours)     Mobility Assessment     Row Name 03/15/23 1300 03/15/23 1030         Does patient have an order for bedrest or is patient medically unstable No - Continue assessment No - Continue assessment      What is the highest level of mobility based on the progressive mobility assessment? -- Level 6 (Walks independently in room and hall) - Balance while walking in room without assist - Complete               Barriers to discharge: none Disposition Plan:  Home Status is: Inpt  Objective: Blood pressure 108/73, pulse 83, temperature 97.9 F (36.6 C), temperature source Oral, resp. rate 17, height 5\' 11"  (1.803 m), weight 62.1 kg, SpO2 94%.  Examination:  Physical Exam Constitutional:      General: He is not in acute distress.    Appearance: Normal appearance.  HENT:     Head: Normocephalic and atraumatic.     Mouth/Throat:     Mouth: Mucous membranes are moist.  Eyes:     Extraocular Movements: Extraocular movements intact.  Cardiovascular:     Rate and Rhythm:  Normal rate. Rhythm irregular.  Pulmonary:     Effort: Pulmonary effort is normal. No respiratory distress.     Breath sounds: Normal breath sounds. No wheezing.  Chest:     Comments: TTP over left posterior chest wall; no bruising noted Abdominal:     General: Bowel sounds are normal. There is no distension.     Palpations: Abdomen is soft.     Tenderness: There is no abdominal tenderness.  Musculoskeletal:        General: Normal range of motion.     Cervical back: Normal range of motion and neck supple.  Skin:    General: Skin is warm and dry.  Neurological:     General: No focal deficit present.      Mental Status: He is alert.  Psychiatric:        Mood and Affect: Mood normal.        Behavior: Behavior normal.      Consultants:  Trauma  Procedures:    Data Reviewed: Results for orders placed or performed during the hospital encounter of 03/14/23 (from the past 24 hour(s))  I-stat chem 8, ED (not at [facility], [facility] or [facility])     Status: Abnormal   Collection Time: 03/14/23  4:25 PM  Result Value Ref Range   Sodium 138 135 - 145 mmol/L   Potassium 4.6 3.5 - 5.1 mmol/L   Chloride 103 98 - 111 mmol/L   BUN 15 8 - 23 mg/dL   Creatinine, Ser 3.87 0.61 - 1.24 mg/dL   Glucose, Bld 564 (H) 70 - 99 mg/dL   Calcium, Ion 3.32 1.15 - 1.40 mmol/L   TCO2 25 22 - 32 mmol/L   Hemoglobin 17.3 (H) 13.0 - 17.0 g/dL   HCT 95.1 88.4 - 16.6 %  Comprehensive metabolic panel     Status: Abnormal   Collection Time: 03/14/23  4:53 PM  Result Value Ref Range   Sodium 136 135 - 145 mmol/L   Potassium 5.0 3.5 - 5.1 mmol/L   Chloride 100 98 - 111 mmol/L   CO2 25 22 - 32 mmol/L   Glucose, Bld 109 (H) 70 - 99 mg/dL   BUN 15 8 - 23 mg/dL   Creatinine, Ser 0.63 0.61 - 1.24 mg/dL   Calcium 9.7 8.9 - 01.6 mg/dL   Total Protein 7.9 6.5 - 8.1 g/dL   Albumin 4.7 3.5 - 5.0 g/dL   AST 31 15 - 41 U/L   ALT 28 0 - 44 U/L   Alkaline Phosphatase 83 38 - 126 U/L   Total Bilirubin 1.0 0.3 - 1.2 mg/dL   GFR, Estimated >01 >09 mL/min   Anion gap 11 5 - 15  HIV Antibody (routine testing w rflx)     Status: None   Collection Time: 03/15/23 12:44 AM  Result Value Ref Range   HIV Screen 4th Generation wRfx Non Reactive Non Reactive  Magnesium     Status: None   Collection Time: 03/15/23 12:44 AM  Result Value Ref Range   Magnesium 2.1 1.7 - 2.4 mg/dL  CBC     Status: Abnormal   Collection Time: 03/15/23  6:49 AM  Result Value Ref Range   WBC 14.1 (H) 4.0 - 10.5 K/uL   RBC 4.27 4.22 - 5.81 MIL/uL   Hemoglobin 15.1 13.0 - 17.0 g/dL   HCT 32.3 55.7 - 32.2 %   MCV 106.3 (H) 80.0 - 100.0 fL   MCH 35.4 (H) 26.0 -  34.0 pg  MCHC 33.3 30.0 - 36.0 g/dL   RDW 56.2 13.0 - 86.5 %   Platelets 268 150 - 400 K/uL   nRBC 0.0 0.0 - 0.2 %  Basic metabolic panel     Status: Abnormal   Collection Time: 03/15/23  6:49 AM  Result Value Ref Range   Sodium 133 (L) 135 - 145 mmol/L   Potassium 4.6 3.5 - 5.1 mmol/L   Chloride 102 98 - 111 mmol/L   CO2 25 22 - 32 mmol/L   Glucose, Bld 129 (H) 70 - 99 mg/dL   BUN 15 8 - 23 mg/dL   Creatinine, Ser 7.84 0.61 - 1.24 mg/dL   Calcium 9.1 8.9 - 69.6 mg/dL   GFR, Estimated >29 >52 mL/min   Anion gap 6 5 - 15  TSH     Status: None   Collection Time: 03/15/23  1:32 PM  Result Value Ref Range   TSH 1.470 0.350 - 4.500 uIU/mL    I have reviewed pertinent nursing notes, vitals, labs, and images as necessary. I have ordered labwork to follow up on as indicated.  I have reviewed the last notes from staff over past 24 hours. I have discussed patient's care plan and test results with nursing staff, CM/SW, and other staff as appropriate.  Time spent: Greater than 50% of the 55 minute visit was spent in counseling/coordination of care for the patient as laid out in the A&P.   LOS: 1 day   Lewie Chamber, MD [facility] 03/15/2023, 2:40 PM

## 2023-03-15 NOTE — Hospital Course (Signed)
Kristopher [name] is a 62 yo male with PMH known afib and ongoing tobacco use who presented to the hospital after falling off of a ladder from approximately 6 feet off the ground.  He landed on his left side and a wooden board. He does not take any medications for A-fib including rate control medications nor anticoagulation.  He was noted to be in A-fib with RVR on evaluation in the ER. He was started on a Cardizem drip for rate control. He underwent further imaging and was found to have a small left-sided pneumothorax and mildly displaced rib fractures involving the left 7th - 9th ribs.  He is transferred to [facility] for further evaluation with the trauma team.

## 2023-03-15 NOTE — ED Notes (Signed)
ED TO INPATIENT HANDOFF REPORT  ED Nurse Name and Phone #:  Leatrice Jewels [phone]    S Name/Age/Gender Kristopher [name] 62 y.o. male Room/Bed: WA22/WA22  Code Status   Code Status: Full Code  Home/SNF/Other Home Patient oriented to: self, place, time, and situation Is this baseline? Yes   Triage Complete: Triage complete  Chief Complaint Multiple rib fractures [S22.49XA]  Triage Note Pt coming in following a fall from a ladder. Pt states that he was apprx 6 ft off the ground, pt endorses left sided rib pain, pt endorses pain and the feeling of grinding/popping in ribs.    Allergies No Known Allergies  Level of Care/Admitting Diagnosis ED Disposition     ED Disposition  Admit   Condition  --   Comment  Hospital Area: [facility] [[id number]]  Level of Care: Progressive [102]  Admit to Progressive based on following criteria: RESPIRATORY PROBLEMS hypoxemic/hypercapnic respiratory failure that is responsive to NIPPV (BiPAP) or High Flow Nasal Cannula (6-80 lpm). Frequent assessment/intervention, no > Q2 hrs < Q4 hrs, to maintain oxygenation and pulmonary hygiene.  May admit patient to [facility] or [facility] if equivalent level of care is available:: Yes  Covid Evaluation: Asymptomatic - no recent exposure (last 10 days) testing not required  Diagnosis: Multiple rib fractures [147829]  Admitting Physician: John Giovanni [[id number]]  Attending Physician: John Giovanni [[id number]]  Certification:: I certify this patient will need inpatient services for at least 2 midnights  Expected Medical Readiness: 03/16/2023          B Medical/Surgery History History reviewed. No pertinent past medical history. Past Surgical History:  Procedure Laterality Date   GANGLION CYST EXCISION       A IV Location/Drains/Wounds Patient Lines/Drains/Airways Status     Active Line/Drains/Airways     Name Placement date Placement time Site Days   Peripheral IV  03/14/23 20 G Anterior;Left;Proximal Forearm 03/14/23  1458  Forearm  1            Intake/Output Last 24 hours  Intake/Output Summary (Last 24 hours) at 03/15/2023 1044 Last data filed at 03/15/2023 0913 Gross per 24 hour  Intake 48.94 ml  Output --  Net 48.94 ml    Labs/Imaging Results for orders placed or performed during the hospital encounter of 03/14/23 (from the past 48 hour(s))  CBC     Status: Abnormal   Collection Time: 03/14/23  2:39 PM  Result Value Ref Range   WBC 14.1 (H) 4.0 - 10.5 K/uL   RBC 5.18 4.22 - 5.81 MIL/uL   Hemoglobin 18.0 (H) 13.0 - 17.0 g/dL   HCT 62.9 (H) 52.8 - 41.3 %   MCV 101.5 (H) 80.0 - 100.0 fL   MCH 34.7 (H) 26.0 - 34.0 pg   MCHC 34.2 30.0 - 36.0 g/dL   RDW 24.4 01.0 - 27.2 %   Platelets 312 150 - 400 K/uL   nRBC 0.0 0.0 - 0.2 %    Comment: Performed at [facility], [address] [place], [place] [address]  I-stat chem 8, ED (not at [facility], [facility] or [facility])     Status: Abnormal   Collection Time: 03/14/23  4:25 PM  Result Value Ref Range   Sodium 138 135 - 145 mmol/L   Potassium 4.6 3.5 - 5.1 mmol/L   Chloride 103 98 - 111 mmol/L   BUN 15 8 - 23 mg/dL   Creatinine, Ser 4.03 0.61 - 1.24 mg/dL   Glucose,  Bld 103 (H) 70 - 99 mg/dL    Comment: Glucose reference range applies only to samples taken after fasting for at least 8 hours.   Calcium, Ion 1.20 1.15 - 1.40 mmol/L   TCO2 25 22 - 32 mmol/L   Hemoglobin 17.3 (H) 13.0 - 17.0 g/dL   HCT 14.7 82.9 - 56.2 %  Comprehensive metabolic panel     Status: Abnormal   Collection Time: 03/14/23  4:53 PM  Result Value Ref Range   Sodium 136 135 - 145 mmol/L   Potassium 5.0 3.5 - 5.1 mmol/L   Chloride 100 98 - 111 mmol/L   CO2 25 22 - 32 mmol/L   Glucose, Bld 109 (H) 70 - 99 mg/dL    Comment: Glucose reference range applies only to samples taken after fasting for at least 8 hours.   BUN 15 8 - 23 mg/dL   Creatinine, Ser 1.30 0.61 - 1.24 mg/dL   Calcium 9.7 8.9 - 86.5 mg/dL    Total Protein 7.9 6.5 - 8.1 g/dL   Albumin 4.7 3.5 - 5.0 g/dL   AST 31 15 - 41 U/L   ALT 28 0 - 44 U/L   Alkaline Phosphatase 83 38 - 126 U/L   Total Bilirubin 1.0 0.3 - 1.2 mg/dL   GFR, Estimated >78 >46 mL/min    Comment: (NOTE) Calculated using the CKD-EPI Creatinine Equation (2021)    Anion gap 11 5 - 15    Comment: Performed at [facility], [address] [place], [place] [address]  HIV Antibody (routine testing w rflx)     Status: None   Collection Time: 03/15/23 12:44 AM  Result Value Ref Range   HIV Screen 4th Generation wRfx Non Reactive Non Reactive    Comment: Performed at [facility], [address] [place], [place] [address]  Magnesium     Status: None   Collection Time: 03/15/23 12:44 AM  Result Value Ref Range   Magnesium 2.1 1.7 - 2.4 mg/dL    Comment: Performed at [facility], [address], [place], [place] [address]  CBC     Status: Abnormal   Collection Time: 03/15/23  6:49 AM  Result Value Ref Range   WBC 14.1 (H) 4.0 - 10.5 K/uL   RBC 4.27 4.22 - 5.81 MIL/uL   Hemoglobin 15.1 13.0 - 17.0 g/dL   HCT 02.7 25.3 - 66.4 %   MCV 106.3 (H) 80.0 - 100.0 fL   MCH 35.4 (H) 26.0 - 34.0 pg   MCHC 33.3 30.0 - 36.0 g/dL   RDW 40.3 47.4 - 25.9 %   Platelets 268 150 - 400 K/uL   nRBC 0.0 0.0 - 0.2 %    Comment: Performed at [facility], [address] [place], [place] [address]  Basic metabolic panel     Status: Abnormal   Collection Time: 03/15/23  6:49 AM  Result Value Ref Range   Sodium 133 (L) 135 - 145 mmol/L   Potassium 4.6 3.5 - 5.1 mmol/L   Chloride 102 98 - 111 mmol/L   CO2 25 22 - 32 mmol/L   Glucose, Bld 129 (H) 70 - 99 mg/dL    Comment: Glucose reference range applies only to samples taken after fasting for at least 8 hours.   BUN 15 8 - 23 mg/dL   Creatinine, Ser 5.64 0.61 - 1.24 mg/dL   Calcium 9.1 8.9 - 33.2 mg/dL   GFR, Estimated >95 >  60 mL/min    Comment: (NOTE) Calculated  using the CKD-EPI Creatinine Equation (2021)    Anion gap 6 5 - 15    Comment: Performed at [facility], [address], [place], [place] [address]   DG CHEST PORT 1 VIEW  Result Date: 03/15/2023 CLINICAL DATA:  [id number] Pneumothorax [id number] EXAM: PORTABLE CHEST 1 VIEW.  Patient is rotated. COMPARISON:  Chest x-ray 03/14/2023 10:04 p.m., CT abdomen pelvis 03/14/2023 FINDINGS: The heart and mediastinal contours are unchanged. Atherosclerotic plaque. Asymmetric prominence of the right hilar vasculature likely due to patient rotation positioning. Right lower lobe vague airspace opacity. Interval increase in interstitial markings. No pleural effusion. Similar-appearing small left apical pneumothorax. Redemonstration of acute displaced left rib fractures. IMPRESSION: 1. Similar-appearing small volume left apical pneumothorax. 2. Right lower lobe vague airspace opacity suggestive of developing pulmonary contusion. 3. Mild pulmonary edema. 4. Redemonstration of acute displaced left rib fractures. Electronically Signed   By: Tish Frederickson M.D.   On: 03/15/2023 05:57   DG Chest 2 View  Result Date: 03/14/2023 CLINICAL DATA:  Larey Seat off ladder pneumothorax EXAM: CHEST - 2 VIEW COMPARISON:  CT 03/14/2023, chest x-ray 03/14/2023 FINDINGS: Negative for pleural effusion. Small left apical pneumothorax demonstrating about 2.8 cm pleural-parenchymal separation at the apex compared with 1.8 cm on prior radiograph. No midline shift. Acute displaced left seventh and eighth and ninth rib fractures. IMPRESSION: 1. Small left apical pneumothorax, slightly increased in size compared to prior radiograph. 2. Acute displaced left seventh, eighth, and ninth rib fractures. Electronically Signed   By: Jasmine Pang M.D.   On: 03/14/2023 22:17   CT CHEST ABDOMEN PELVIS W CONTRAST  Result Date: 03/14/2023 CLINICAL DATA:  Fall off ladder. EXAM: CT CHEST, ABDOMEN, AND PELVIS WITH CONTRAST TECHNIQUE: Multidetector CT  imaging of the chest, abdomen and pelvis was performed following the standard protocol during bolus administration of intravenous contrast. RADIATION DOSE REDUCTION: This exam was performed according to the departmental dose-optimization program which includes automated exposure control, adjustment of the mA and/or kV according to patient size and/or use of iterative reconstruction technique. CONTRAST:  OMNIPAQUE IOHEXOL 300 MG/ML  SOLN COMPARISON:  None Available. FINDINGS: CT CHEST FINDINGS Cardiovascular: No significant vascular findings. Normal heart size. No pericardial effusion. Mediastinum/Nodes: No enlarged mediastinal, hilar, or axillary lymph nodes. Thyroid gland, trachea, and esophagus demonstrate no significant findings. Lungs/Pleura: Small left apical and anterior pneumothorax is noted as described on prior radiograph. No effusion is noted. No consolidation is noted. Musculoskeletal: Mildly displaced fractures are seen involving the lateral portions of the left seventh, eighth and ninth ribs. CT ABDOMEN PELVIS FINDINGS Hepatobiliary: No cholelithiasis or biliary dilatation is noted. Multiple small calcifications are seen throughout hepatic parenchyma suggesting old inflammation. Pancreas: Unremarkable. No pancreatic ductal dilatation or surrounding inflammatory changes. Spleen: Multiple calcified granulomas are noted. Adrenals/Urinary Tract: Adrenal glands are unremarkable. Kidneys are normal, without renal calculi, focal lesion, or hydronephrosis. Bladder is unremarkable. Stomach/Bowel: Stomach is within normal limits. Appendix appears normal. No evidence of bowel wall thickening, distention, or inflammatory changes. Sigmoid diverticulosis is noted without inflammation. Vascular/Lymphatic: Aortic atherosclerosis. No enlarged abdominal or pelvic lymph nodes. Reproductive: Prostate is unremarkable. Other: No abdominal wall hernia or abnormality. No abdominopelvic ascites. Musculoskeletal: No acute  or significant osseous findings. IMPRESSION: Small left-sided pneumothorax is noted as described on prior chest radiograph. Mildly displaced left seventh, eighth and ninth rib fractures. No definite traumatic injury seen in the abdomen or pelvis. Sigmoid diverticulosis without inflammation. Aortic Atherosclerosis (ICD10-I70.0).  Electronically Signed   By: Lupita Raider M.D.   On: 03/14/2023 18:38   DG Chest Portable 1 View  Result Date: 03/14/2023 CLINICAL DATA:  Left rib pain after fall off ladder. EXAM: PORTABLE CHEST 1 VIEW COMPARISON:  July 31, 2016. FINDINGS: The heart size and mediastinal contours are within normal limits. Small left apical pneumothorax is noted. Mildly displaced left seventh and eighth rib fractures are noted. No consolidative process is noted. IMPRESSION: Mildly displaced left seventh and eighth rib fractures. Small left apical pneumothorax. Critical Value/emergent results were called by telephone at the time of interpretation on 03/14/2023 at 4:27 pm to provider Dr. Jearld Fenton, who verbally acknowledged these results. Electronically Signed   By: Lupita Raider M.D.   On: 03/14/2023 16:27   DG Hip Unilat W or Wo Pelvis 2-3 Views Left  Result Date: 03/14/2023 CLINICAL DATA:  Fall off ladder. EXAM: DG HIP (WITH OR WITHOUT PELVIS) 2-3V LEFT COMPARISON:  None Available. FINDINGS: There is no evidence of hip fracture or dislocation. There is no evidence of arthropathy or other focal bone abnormality. IMPRESSION: Negative. Electronically Signed   By: Lupita Raider M.D.   On: 03/14/2023 16:22    Pending Labs Unresulted Labs (From admission, onward)     Start     Ordered   03/15/23 0007  TSH  Once,   R        03/15/23 0008            Vitals/Pain Today's Vitals   03/15/23 0800 03/15/23 0830 03/15/23 0900 03/15/23 0930  BP: 111/79 118/83 129/71 113/72  Pulse: (!) 133 (!) 164 76 83  Resp: 13 15 16 14   Temp:      TempSrc:      SpO2: 97% 99% 98% 95%  Weight:       Height:      PainSc:        Isolation Precautions No active isolations  Medications Medications  nicotine (NICODERM CQ - dosed in mg/24 hours) patch 14 mg (14 mg Transdermal Patch Applied 03/15/23 0955)  HYDROmorphone (DILAUDID) injection 0.5-2 mg (has no administration in time range)  methocarbamol (ROBAXIN) 1,000 mg in dextrose 5 % 100 mL IVPB (has no administration in time range)  methocarbamol (ROBAXIN) tablet 1,000 mg (has no administration in time range)  acetaminophen (TYLENOL) tablet 1,000 mg (1,000 mg Oral Not Given 03/15/23 0504)  oxyCODONE (Oxy IR/ROXICODONE) immediate release tablet 5-10 mg (has no administration in time range)  ondansetron (ZOFRAN) injection 4 mg (has no administration in time range)    Or  ondansetron (ZOFRAN) 8 mg in sodium chloride 0.9 % 50 mL IVPB (has no administration in time range)  phenol (CHLORASEPTIC) mouth spray 2 spray (has no administration in time range)  menthol-cetylpyridinium (CEPACOL) lozenge 3 mg (has no administration in time range)  magic mouthwash (has no administration in time range)  alum & mag hydroxide-simeth (MAALOX/MYLANTA) 200-200-20 MG/5ML suspension 30 mL (has no administration in time range)  simethicone (MYLICON) 40 MG/0.6ML suspension 80 mg (has no administration in time range)  psyllium (HYDROCIL/METAMUCIL) 1 packet (1 packet Oral Given 03/15/23 0949)  bisacodyl (DULCOLAX) suppository 10 mg (has no administration in time range)  naphazoline-glycerin (CLEAR EYES REDNESS) ophth solution 1-2 drop (has no administration in time range)  sodium chloride (OCEAN) 0.65 % nasal spray 1-2 spray (has no administration in time range)  guaiFENesin-dextromethorphan (ROBITUSSIN DM) 100-10 MG/5ML syrup 15 mL (has no administration in time range)  albuterol (PROVENTIL) (2.5 MG/3ML)  0.083% nebulizer solution 2.5 mg (has no administration in time range)  diltiazem (CARDIZEM) 125 mg in dextrose 5% 125 mL (1 mg/mL) infusion (5 mg/hr Intravenous  Infusion Verify 03/15/23 0913)  naloxone [facility]) injection 0.4 mg (has no administration in time range)  ibuprofen (ADVIL) tablet 800 mg (800 mg Oral Given 03/15/23 0948)  gabapentin (NEURONTIN) capsule 300 mg (300 mg Oral Given 03/15/23 0949)  HYDROmorphone (DILAUDID) injection 1 mg (1 mg Intravenous Given 03/14/23 1505)  lactated ringers bolus 1,000 mL (0 mLs Intravenous Stopped 03/14/23 1800)  iohexol (OMNIPAQUE) 300 MG/ML solution 100 mL (100 mLs Intravenous Contrast Given 03/14/23 1653)  HYDROmorphone (DILAUDID) injection 1 mg (1 mg Intravenous Given 03/14/23 1923)  ondansetron (ZOFRAN) injection 4 mg (4 mg Intravenous Given 03/14/23 1917)  metoprolol tartrate (LOPRESSOR) injection 5 mg (5 mg Intravenous Given 03/14/23 2128)  morphine (PF) 4 MG/ML injection 4 mg (4 mg Intravenous Given 03/14/23 2134)  lactated ringers bolus 500 mL (0 mLs Intravenous Stopped 03/14/23 2223)  LORazepam (ATIVAN) injection 0.5 mg (0.5 mg Intravenous Given 03/14/23 2239)    Mobility walks     Focused Assessments     R Recommendations: See Admitting Provider Note  Report given to:   Additional Notes:

## 2023-03-15 NOTE — Assessment & Plan Note (Signed)
-   Mildly displaced left-sided rib fractures involving 7th - 9th ribs - encourage IS - continue pain control  - ambulate as able

## 2023-03-15 NOTE — Progress Notes (Signed)
Subjective/Chief Complaint: Feels ok. Pain is controlled. Does not have IS   Objective: Vital signs in last 24 hours: Temp:  [97.7 F (36.5 C)-98.9 F (37.2 C)] 97.7 F (36.5 C) (09/12 0730) Pulse Rate:  [41-132] 99 (09/12 0730) Resp:  [10-24] 15 (09/12 0730) BP: (93-146)/(63-108) 125/87 (09/12 0730) SpO2:  [79 %-99 %] 99 % (09/12 0730) Weight:  [62.1 kg] 62.1 kg (09/11 1337)    Intake/Output from previous day: No intake/output data recorded. Intake/Output this shift: No intake/output data recorded.  A&Ox3 Unlabored resp, sat 100% on 2lNC. Intermittent coarse cough In a fib rate 95-120 Abd s, nt, nd.  Lab Results:  Recent Labs    03/14/23 1439 03/14/23 1625 03/15/23 0649  WBC 14.1*  --  14.1*  HGB 18.0* 17.3* 15.1  HCT 52.6* 51.0 45.4  PLT 312  --  268   BMET Recent Labs    03/14/23 1653 03/15/23 0649  NA 136 133*  K 5.0 4.6  CL 100 102  CO2 25 25  GLUCOSE 109* 129*  BUN 15 15  CREATININE 0.97 0.69  CALCIUM 9.7 9.1   PT/INR No results for input(s): "LABPROT", "INR" in the last 72 hours. ABG No results for input(s): "PHART", "HCO3" in the last 72 hours.  Invalid input(s): "PCO2", "PO2"  Studies/Results: DG CHEST PORT 1 VIEW  Result Date: 03/15/2023 CLINICAL DATA:  [id number] Pneumothorax [id number] EXAM: PORTABLE CHEST 1 VIEW.  Patient is rotated. COMPARISON:  Chest x-ray 03/14/2023 10:04 p.m., CT abdomen pelvis 03/14/2023 FINDINGS: The heart and mediastinal contours are unchanged. Atherosclerotic plaque. Asymmetric prominence of the right hilar vasculature likely due to patient rotation positioning. Right lower lobe vague airspace opacity. Interval increase in interstitial markings. No pleural effusion. Similar-appearing small left apical pneumothorax. Redemonstration of acute displaced left rib fractures. IMPRESSION: 1. Similar-appearing small volume left apical pneumothorax. 2. Right lower lobe vague airspace opacity suggestive of developing pulmonary  contusion. 3. Mild pulmonary edema. 4. Redemonstration of acute displaced left rib fractures. Electronically Signed   By: Tish Frederickson M.D.   On: 03/15/2023 05:57   DG Chest 2 View  Result Date: 03/14/2023 CLINICAL DATA:  Larey Seat off ladder pneumothorax EXAM: CHEST - 2 VIEW COMPARISON:  CT 03/14/2023, chest x-ray 03/14/2023 FINDINGS: Negative for pleural effusion. Small left apical pneumothorax demonstrating about 2.8 cm pleural-parenchymal separation at the apex compared with 1.8 cm on prior radiograph. No midline shift. Acute displaced left seventh and eighth and ninth rib fractures. IMPRESSION: 1. Small left apical pneumothorax, slightly increased in size compared to prior radiograph. 2. Acute displaced left seventh, eighth, and ninth rib fractures. Electronically Signed   By: Jasmine Pang M.D.   On: 03/14/2023 22:17   CT CHEST ABDOMEN PELVIS W CONTRAST  Result Date: 03/14/2023 CLINICAL DATA:  Fall off ladder. EXAM: CT CHEST, ABDOMEN, AND PELVIS WITH CONTRAST TECHNIQUE: Multidetector CT imaging of the chest, abdomen and pelvis was performed following the standard protocol during bolus administration of intravenous contrast. RADIATION DOSE REDUCTION: This exam was performed according to the departmental dose-optimization program which includes automated exposure control, adjustment of the mA and/or kV according to patient size and/or use of iterative reconstruction technique. CONTRAST:  OMNIPAQUE IOHEXOL 300 MG/ML  SOLN COMPARISON:  None Available. FINDINGS: CT CHEST FINDINGS Cardiovascular: No significant vascular findings. Normal heart size. No pericardial effusion. Mediastinum/Nodes: No enlarged mediastinal, hilar, or axillary lymph nodes. Thyroid gland, trachea, and esophagus demonstrate no significant findings. Lungs/Pleura: Small left apical and anterior pneumothorax is noted as described  on prior radiograph. No effusion is noted. No consolidation is noted. Musculoskeletal: Mildly displaced  fractures are seen involving the lateral portions of the left seventh, eighth and ninth ribs. CT ABDOMEN PELVIS FINDINGS Hepatobiliary: No cholelithiasis or biliary dilatation is noted. Multiple small calcifications are seen throughout hepatic parenchyma suggesting old inflammation. Pancreas: Unremarkable. No pancreatic ductal dilatation or surrounding inflammatory changes. Spleen: Multiple calcified granulomas are noted. Adrenals/Urinary Tract: Adrenal glands are unremarkable. Kidneys are normal, without renal calculi, focal lesion, or hydronephrosis. Bladder is unremarkable. Stomach/Bowel: Stomach is within normal limits. Appendix appears normal. No evidence of bowel wall thickening, distention, or inflammatory changes. Sigmoid diverticulosis is noted without inflammation. Vascular/Lymphatic: Aortic atherosclerosis. No enlarged abdominal or pelvic lymph nodes. Reproductive: Prostate is unremarkable. Other: No abdominal wall hernia or abnormality. No abdominopelvic ascites. Musculoskeletal: No acute or significant osseous findings. IMPRESSION: Small left-sided pneumothorax is noted as described on prior chest radiograph. Mildly displaced left seventh, eighth and ninth rib fractures. No definite traumatic injury seen in the abdomen or pelvis. Sigmoid diverticulosis without inflammation. Aortic Atherosclerosis (ICD10-I70.0). Electronically Signed   By: Lupita Raider M.D.   On: 03/14/2023 18:38   DG Chest Portable 1 View  Result Date: 03/14/2023 CLINICAL DATA:  Left rib pain after fall off ladder. EXAM: PORTABLE CHEST 1 VIEW COMPARISON:  July 31, 2016. FINDINGS: The heart size and mediastinal contours are within normal limits. Small left apical pneumothorax is noted. Mildly displaced left seventh and eighth rib fractures are noted. No consolidative process is noted. IMPRESSION: Mildly displaced left seventh and eighth rib fractures. Small left apical pneumothorax. Critical Value/emergent results were called  by telephone at the time of interpretation on 03/14/2023 at 4:27 pm to provider Dr. Jearld Fenton, who verbally acknowledged these results. Electronically Signed   By: Lupita Raider M.D.   On: 03/14/2023 16:27   DG Hip Unilat W or Wo Pelvis 2-3 Views Left  Result Date: 03/14/2023 CLINICAL DATA:  Fall off ladder. EXAM: DG HIP (WITH OR WITHOUT PELVIS) 2-3V LEFT COMPARISON:  None Available. FINDINGS: There is no evidence of hip fracture or dislocation. There is no evidence of arthropathy or other focal bone abnormality. IMPRESSION: Negative. Electronically Signed   By: Lupita Raider M.D.   On: 03/14/2023 16:22    Anti-infectives: Anti-infectives (From admission, onward)    None       Assessment/Plan: 62yo s/p fall  Rib 7-9 fx with small ptx: stable on todays CXR, sats are ok. Aggressive pulm toilet, mutimodal pain control, repeat CXR tomorrow.  A fib rvr- per primary team, currently on dilt gtt  Trauma service will follow   LOS: 1 day    Kristopher [name] 03/15/2023

## 2023-03-15 NOTE — Assessment & Plan Note (Addendum)
-   s/p fall from ladder approx 6 ft off ground; stable but persistent small volume left apical PNX per imaging - trauma team following for surveillance and management   - repeat CXR in am

## 2023-03-15 NOTE — Assessment & Plan Note (Signed)
-  check folate and B12

## 2023-03-15 NOTE — Progress Notes (Signed)
Echocardiogram 2D Echocardiogram has been performed.  Lucendia Herrlich 03/15/2023, 3:59 PM

## 2023-03-15 NOTE — Plan of Care (Signed)
Patient states pain is less when takes a deep breath.

## 2023-03-15 NOTE — Progress Notes (Signed)
Patient refused to walk out in the hall after his echo at bedside patient very sore.

## 2023-03-15 NOTE — Plan of Care (Signed)
  Problem: Education: Goal: Knowledge of General Education information will improve Description: Including pain rating scale, medication(s)/side effects and non-pharmacologic comfort measures Outcome: Progressing   Problem: Health Behavior/Discharge Planning: Goal: Ability to manage health-related needs will improve Outcome: Progressing   Problem: Clinical Measurements: Goal: Ability to maintain clinical measurements within normal limits will improve Outcome: Progressing Goal: Will remain free from infection Outcome: Progressing Goal: Diagnostic test results will improve Outcome: Progressing Goal: Cardiovascular complication will be avoided Outcome: Progressing   Problem: Activity: Goal: Risk for activity intolerance will decrease Outcome: Progressing   Problem: Nutrition: Goal: Adequate nutrition will be maintained Outcome: Progressing   Problem: Coping: Goal: Level of anxiety will decrease Outcome: Progressing   Problem: Pain Managment: Goal: General experience of comfort will improve Outcome: Progressing   Problem: Safety: Goal: Ability to remain free from injury will improve Outcome: Progressing   Problem: Skin Integrity: Goal: Risk for impaired skin integrity will decrease Outcome: Progressing   

## 2023-03-15 NOTE — Progress Notes (Signed)
Patient admitted from [facility]. Patient is a/0 x4. Mae's x 4. Patient is sore more on the left side from fall off a ladder yesterday. Small bruise abrasion noted on left upper outer thigh. Patient is on 2l nasal cannula. Patient denies need for pain med at this time. No other skin issues noted. Patient is on a cardizem drip.

## 2023-03-15 NOTE — Assessment & Plan Note (Signed)
-   hemoconcentrated - normalized

## 2023-03-15 NOTE — Care Management (Signed)
  Transition of Care [facility]) Screening Note   Patient Details  Name: Kristopher [name] He Date of Birth: Sep 28, 1960   Transition of Care [facility]) CM/SW Contact:    Lockie Pares, RN Phone Number: 03/15/2023, 3:02 PM    Transition of Care Department [facility]) has reviewed patient and no TOC needs have been identified at this time. We will continue to monitor patient advancement through interdisciplinary progression rounds. If new patient transition needs arise, please place a TOC consult.

## 2023-03-15 NOTE — Assessment & Plan Note (Addendum)
-   known history; patient states no meds at home for treatment - TSH normal, 1.470 - CHA2DS2-VASc = 0 points. Technically no medication indicated at this time either; would be appropriate for asa at 62 yo - wean cardizem drip off; suspect RVR triggered by trauma/stress

## 2023-03-16 ENCOUNTER — Inpatient Hospital Stay (HOSPITAL_COMMUNITY): Payer: 59

## 2023-03-16 ENCOUNTER — Encounter (HOSPITAL_COMMUNITY)
Admission: EM | Disposition: A | Discharge status: Home / Self Care | Payer: Self-pay | Source: Home / Self Care | Attending: Internal Medicine

## 2023-03-16 ENCOUNTER — Inpatient Hospital Stay (HOSPITAL_COMMUNITY): Payer: 59 | Admitting: Anesthesiology

## 2023-03-16 DIAGNOSIS — S2242XK Multiple fractures of ribs, left side, subsequent encounter for fracture with nonunion: Secondary | ICD-10-CM | POA: Diagnosis not present

## 2023-03-16 DIAGNOSIS — D7589 Other specified diseases of blood and blood-forming organs: Secondary | ICD-10-CM | POA: Diagnosis not present

## 2023-03-16 DIAGNOSIS — F17210 Nicotine dependence, cigarettes, uncomplicated: Secondary | ICD-10-CM

## 2023-03-16 DIAGNOSIS — I4891 Unspecified atrial fibrillation: Secondary | ICD-10-CM

## 2023-03-16 DIAGNOSIS — J939 Pneumothorax, unspecified: Secondary | ICD-10-CM | POA: Diagnosis not present

## 2023-03-16 HISTORY — PX: TEE WITHOUT CARDIOVERSION: SHX5443

## 2023-03-16 HISTORY — PX: CARDIOVERSION: SHX1299

## 2023-03-16 LAB — CBC WITH DIFFERENTIAL/PLATELET
Abs Immature Granulocytes: 0.07 10*3/uL (ref 0.00–0.07)
Basophils Absolute: 0.1 10*3/uL (ref 0.0–0.1)
Basophils Relative: 1 %
Eosinophils Absolute: 0.3 10*3/uL (ref 0.0–0.5)
Eosinophils Relative: 2 %
HCT: 44.5 % (ref 39.0–52.0)
Hemoglobin: 14.7 g/dL (ref 13.0–17.0)
Immature Granulocytes: 1 %
Lymphocytes Relative: 8 %
Lymphs Abs: 1.1 10*3/uL (ref 0.7–4.0)
MCH: 33.9 pg (ref 26.0–34.0)
MCHC: 33 g/dL (ref 30.0–36.0)
MCV: 102.8 fL — ABNORMAL HIGH (ref 80.0–100.0)
Monocytes Absolute: 1.4 10*3/uL — ABNORMAL HIGH (ref 0.1–1.0)
Monocytes Relative: 10 %
Neutro Abs: 10.5 10*3/uL — ABNORMAL HIGH (ref 1.7–7.7)
Neutrophils Relative %: 78 %
Platelets: 248 10*3/uL (ref 150–400)
RBC: 4.33 MIL/uL (ref 4.22–5.81)
RDW: 12 % (ref 11.5–15.5)
WBC: 13.4 10*3/uL — ABNORMAL HIGH (ref 4.0–10.5)
nRBC: 0 % (ref 0.0–0.2)

## 2023-03-16 LAB — MAGNESIUM: Magnesium: 1.8 mg/dL (ref 1.7–2.4)

## 2023-03-16 LAB — FOLATE: Folate: 9.6 ng/mL (ref >5.9)

## 2023-03-16 LAB — BASIC METABOLIC PANEL
Anion gap: 11 (ref 5–15)
BUN: 13 mg/dL (ref 8–23)
CO2: 27 mmol/L (ref 22–32)
Calcium: 9.1 mg/dL (ref 8.9–10.3)
Chloride: 98 mmol/L (ref 98–111)
Creatinine, Ser: 0.75 mg/dL (ref 0.61–1.24)
GFR, Estimated: >60 mL/min (ref >60)
Glucose, Bld: 87 mg/dL (ref 70–99)
Potassium: 4.2 mmol/L (ref 3.5–5.1)
Sodium: 136 mmol/L (ref 135–145)

## 2023-03-16 LAB — LIPID PANEL
Cholesterol: 164 mg/dL (ref 0–200)
HDL: 90 mg/dL (ref >40)
LDL Cholesterol: 68 mg/dL (ref 0–99)
Total CHOL/HDL Ratio: 1.8 ratio
Triglycerides: 32 mg/dL (ref <150)
VLDL: 6 mg/dL (ref 0–40)

## 2023-03-16 LAB — HEPARIN LEVEL (UNFRACTIONATED): Heparin Unfractionated: 0.14 [IU]/mL — ABNORMAL LOW (ref 0.30–0.70)

## 2023-03-16 LAB — ECHO TEE

## 2023-03-16 LAB — VITAMIN B12: Vitamin B-12: 376 pg/mL (ref 180–914)

## 2023-03-16 SURGERY — ECHOCARDIOGRAM, TRANSESOPHAGEAL
Anesthesia: Monitor Anesthesia Care

## 2023-03-16 MED ORDER — ROSUVASTATIN CALCIUM 5 MG PO TABS
10.0000 mg | ORAL_TABLET | Freq: Every day | ORAL | Status: DC
  Administered 2023-03-16 – 2023-03-19 (×4): 10 mg via ORAL
  Filled 2023-03-16 (×4): qty 2

## 2023-03-16 MED ORDER — HEPARIN BOLUS VIA INFUSION
3000.0000 [IU] | Freq: Once | INTRAVENOUS | Status: AC
  Administered 2023-03-16: 3000 [IU] via INTRAVENOUS
  Filled 2023-03-16: qty 3000

## 2023-03-16 MED ORDER — LIDOCAINE 5 % EX PTCH
1.0000 | MEDICATED_PATCH | CUTANEOUS | Status: DC
  Administered 2023-03-16 – 2023-03-18 (×3): 1 via TRANSDERMAL
  Filled 2023-03-16 (×3): qty 1

## 2023-03-16 MED ORDER — HEPARIN (PORCINE) 25000 UT/250ML-% IV SOLN
1650.0000 [IU]/h | INTRAVENOUS | Status: DC
  Administered 2023-03-16: 900 [IU]/h via INTRAVENOUS
  Administered 2023-03-17: 1100 [IU]/h via INTRAVENOUS
  Administered 2023-03-18: 1550 [IU]/h via INTRAVENOUS
  Administered 2023-03-19: 1650 [IU]/h via INTRAVENOUS
  Filled 2023-03-16 (×6): qty 250

## 2023-03-16 MED ORDER — SODIUM CHLORIDE 0.9 % IV SOLN: INTRAVENOUS | Status: DC

## 2023-03-16 MED ORDER — PROPOFOL 10 MG/ML IV BOLUS
INTRAVENOUS | Status: DC | PRN
  Administered 2023-03-16 (×3): 50 mg via INTRAVENOUS

## 2023-03-16 MED ORDER — ESMOLOL HCL 100 MG/10ML IV SOLN
INTRAVENOUS | Status: DC | PRN
Start: 2023-03-16 — End: 2023-03-16
  Administered 2023-03-16: 30 mg via INTRAVENOUS

## 2023-03-16 MED ORDER — METHOCARBAMOL 500 MG PO TABS
1000.0000 mg | ORAL_TABLET | Freq: Three times a day (TID) | ORAL | Status: DC
  Administered 2023-03-16 – 2023-03-19 (×10): 1000 mg via ORAL
  Filled 2023-03-16 (×10): qty 2

## 2023-03-16 MED ORDER — DILTIAZEM HCL 30 MG PO TABS
30.0000 mg | ORAL_TABLET | Freq: Four times a day (QID) | ORAL | Status: DC
  Administered 2023-03-16 – 2023-03-17 (×4): 30 mg via ORAL
  Filled 2023-03-16 (×4): qty 1

## 2023-03-16 SURGICAL SUPPLY — 1 items: ELECT DEFIB PAD ADLT CADENCE (PAD) ×1 IMPLANT

## 2023-03-16 NOTE — CV Procedure (Signed)
TEE: Under moderate sedation, TEE was performed without complications: LV: Normal. Normal EF. RV: Normal LA: Normal. Left atrial appendage: Normal without thrombus. Normal function. Inter atrial septum is intact without defect. Double contrast study negative for atrial level shunting. No late appearance of bubbles either. RA: Normal  MV: Normal Mild MR. TV: Normal Trace TR AV: Normal. No AI or AS. PV: Normal. No PI  Thoracic and ascending aorta: Normal without significant plaque or atheromatous changes.  Direct current cardioversion 03/16/2023 4:47 PM  Indication symptomatic A. Fibrillation.  Procedure: Using MAC anesthesia for achieving deep sedation, TEE followed by synchronized direct current cardioversion performed. Patient was delivered with 150 joules of electricity X 1 with success to NSR. Patient tolerated the procedure well. No immediate complication noted.

## 2023-03-16 NOTE — Interval H&P Note (Signed)
History and Physical Interval Note:  03/16/2023 4:17 PM  Kristopher [name]  has presented today for surgery, with the diagnosis of afib.  The various methods of treatment have been discussed with the patient and family. After consideration of risks, benefits and other options for treatment, the patient has consented to  Procedure(s): TRANSESOPHAGEAL ECHOCARDIOGRAM (N/A) CARDIOVERSION (N/A) as a surgical intervention.  The patient's history has been reviewed, patient examined, no change in status, stable for surgery.  I have reviewed the patient's chart and labs.  Questions were answered to the patient's satisfaction.     Yates Decamp

## 2023-03-16 NOTE — Progress Notes (Signed)
ANTICOAGULATION CONSULT NOTE  Pharmacy Consult for Heparin Indication: atrial fibrillation  No Known Allergies  Patient Measurements: Height: 5\' 11"  (180.3 cm) Weight: 62.1 kg (137 lb) IBW/kg (Calculated) : 75.3 Heparin Dosing Weight: 62 kg  Vital Signs: Temp: 99.5 F (37.5 C) (09/13 0700) Temp Source: Oral (09/13 0700) BP: 110/85 (09/13 0700) Pulse Rate: 83 (09/13 0700)  Labs: Recent Labs    03/14/23 1439 03/14/23 1625 03/14/23 1653 03/15/23 0649  HGB 18.0* 17.3*  --  15.1  HCT 52.6* 51.0  --  45.4  PLT 312  --   --  268  CREATININE  --  0.80 0.97 0.69    Estimated Creatinine Clearance: 83 mL/min (by C-G formula based on SCr of 0.69 mg/dL).   Medical History: History reviewed. No pertinent past medical history.   Assessment: 62 y.o. male with no PMH who is admitted presented after a fall from a ladder with with left 7 through 9 rib fractures and small left pneumothorax, found to be in A-fib with RVR. Pharmacy consulted to manage heparin. CBC stable, platelets 268.  Goal of Therapy:  Heparin level 0.3-0.7 units/ml Monitor platelets by anticoagulation protocol: Yes   Plan:  Heparin 3000 units x1 IV Start heparin infusion at 900 units/hr Check heparin level in 6 hours and daily while on heparin Continue to monitor H&H and platelets   Thank you for allowing pharmacy to be a part of this patient's care.  Thelma Barge, PharmD Clinical Pharmacist

## 2023-03-16 NOTE — TOC CAGE-AID Note (Signed)
Transition of Care [facility]) - CAGE-AID Screening   Patient Details  Name: Kristopher [name] MRN: [id number] Date of Birth: 05/08/1961  Transition of Care [facility]) CM/SW Contact:    Mearl Latin, LCSW Phone Number: 03/16/2023, 9:46 AM   Clinical Narrative: Patient reported no ETOH or substance use. Resources not indicated.    CAGE-AID Screening:    Have You Ever Felt You Ought to Cut Down on Your Drinking or Drug Use?: No Have People Annoyed You By Critizing Your Drinking Or Drug Use?: No Have You Felt Bad Or Guilty About Your Drinking Or Drug Use?: No Have You Ever Had a Drink or Used Drugs First Thing In The Morning to Steady Your Nerves or to Get Rid of a Hangover?: No CAGE-AID Score: 0  Substance Abuse Education Offered: No

## 2023-03-16 NOTE — Consult Note (Signed)
CARDIOLOGY CONSULT NOTE  Patient ID: Kristopher [name] MRN: [id number] DOB/AGE: 08/28/1960 62 y.o.  Admit date: 03/14/2023 Referring Physician  Jeoffrey Massed, MD Primary Physician:  Patient, No Pcp Per Reason for Consultation  A. Fib with RVR  Patient ID: Kristopher [name], male    DOB: 1961-04-17, 62 y.o.   MRN: [id number]  Chief Complaint  Patient presents with   Fall        HPI:    Kristopher [name]  is a 62 y.o. male with medical history significant of cigarette smoking presented to the ED on 03/14/2023 after a fall from a ladder which was approximately 6 feet above the ground.  Patient complained of left-sided rib pain and left hip pain.  He has been diagnosed with left 7 through 9 rib fractures and small left pneumothorax.  In the ED he was found to be in A-fib with RVR and was started on diltiazem drip.  Since he has been in persistent atrial fibrillation with RVR 2 days later, I was consulted to further manage and evaluate.  Patient presently not having any active chest pain or feeling of palpitations or dyspnea.  He has never had any cardiac workup.  Smokes about 1.5 packs of cigarettes a day.  His wife is present.  States that he was able to walk 5 to 6 miles a day until 3 months ago when he retired.  He still remains active.  Denies symptoms of chest pain, angina, dyspnea or claudication.  He also drinks about 6-8 beers a day.  History reviewed. No pertinent past medical history. Past Surgical History:  Procedure Laterality Date   GANGLION CYST EXCISION     Social History   Tobacco Use   Smoking status: Every Day    Current packs/day: 1.00    Average packs/day: 1 pack/day for 40.0 years (40.0 ttl pk-yrs)    Types: Cigarettes   Smokeless tobacco: Never  Substance Use Topics   Alcohol use: No    Family History  Problem Relation Age of Onset   Stroke Mother    Heart disease Father    Heart attack Father    Diabetes Sister    Diabetes Brother     Marital Status:  Single  ROS  Review of Systems  Cardiovascular:  Positive for chest pain (on deep breath left chest). Negative for dyspnea on exertion and leg swelling.   Objective      03/16/2023   12:00 PM 03/16/2023   11:33 AM 03/16/2023   11:18 AM  Vitals with BMI  Systolic 116 117 782  Diastolic 77 81 78  Pulse 97 89     Blood pressure 116/77, pulse 97, temperature 97.9 F (36.6 C), temperature source Oral, resp. rate 19, height 5\' 11"  (1.803 m), weight 62.1 kg, SpO2 91%.    Physical Exam Neck:     Vascular: No carotid bruit or JVD.  Cardiovascular:     Rate and Rhythm: Tachycardia present. Rhythm irregular.     Pulses: Normal pulses and intact distal pulses.     Heart sounds: No murmur heard. Pulmonary:     Effort: Pulmonary effort is normal.     Breath sounds: Normal breath sounds.  Abdominal:     General: Bowel sounds are normal.     Palpations: Abdomen is soft.  Musculoskeletal:     Right lower leg: No edema.     Left lower leg: No edema.  Skin:    Capillary Refill: Capillary refill takes less than  2 seconds.    Laboratory examination:   Recent Labs    03/14/23 1653 03/15/23 0649 03/16/23 1246  NA 136 133* 136  K 5.0 4.6 4.2  CL 100 102 98  CO2 25 25 27   GLUCOSE 109* 129* 87  BUN 15 15 13   CREATININE 0.97 0.69 0.75  CALCIUM 9.7 9.1 9.1  GFRNONAA >60 >60 >60   estimated creatinine clearance is 83 mL/min (by C-G formula based on SCr of 0.75 mg/dL).     Latest Ref Rng & Units 03/16/2023   12:46 PM 03/15/2023    6:49 AM 03/14/2023    4:53 PM  CMP  Glucose 70 - 99 mg/dL 87  621  308   BUN 8 - 23 mg/dL 13  15  15    Creatinine 0.61 - 1.24 mg/dL 6.57  8.46  9.62   Sodium 135 - 145 mmol/L 136  133  136   Potassium 3.5 - 5.1 mmol/L 4.2  4.6  5.0   Chloride 98 - 111 mmol/L 98  102  100   CO2 22 - 32 mmol/L 27  25  25    Calcium 8.9 - 10.3 mg/dL 9.1  9.1  9.7   Total Protein 6.5 - 8.1 g/dL   7.9   Total Bilirubin 0.3 - 1.2 mg/dL   1.0   Alkaline Phos 38 - 126 U/L   83    AST 15 - 41 U/L   31   ALT 0 - 44 U/L   28       Latest Ref Rng & Units 03/16/2023   12:46 PM 03/15/2023    6:49 AM 03/14/2023    4:25 PM  CBC  WBC 4.0 - 10.5 K/uL 13.4  14.1    Hemoglobin 13.0 - 17.0 g/dL 95.2  84.1  32.4   Hematocrit 39.0 - 52.0 % 44.5  45.4  51.0   Platelets 150 - 400 K/uL 248  268     Lipid Panel No results for input(s): "CHOL", "TRIG", "LDLCALC", "VLDL", "HDL", "CHOLHDL", "LDLDIRECT" in the last 8760 hours.  HEMOGLOBIN A1C No results found for: "HGBA1C", "MPG" TSH Recent Labs    03/15/23 1332  TSH 1.470   BNP (last 3 results) No results for input(s): "BNP" in the last 8760 hours. Cardiac Panel (last 3 results) No results for input(s): "CKTOTAL", "CKMB", "TROPONINIHS", "RELINDX" in the last 72 hours.   Medications and allergies  No Known Allergies   Current Meds  Medication Sig   nicotine polacrilex (NICORETTE) 4 MG gum Take 4 mg by mouth as needed for smoking cessation.    Scheduled Meds:  acetaminophen  1,000 mg Oral Q6H   diltiazem  30 mg Oral Q6H   gabapentin  300 mg Oral TID   ibuprofen  800 mg Oral TID   lidocaine  1 patch Transdermal Q24H   methocarbamol  1,000 mg Oral TID   nicotine  14 mg Transdermal Daily   psyllium  1 packet Oral BID   Continuous Infusions:  sodium chloride Stopped (03/16/23 1133)   diltiazem (CARDIZEM) infusion Stopped (03/16/23 0935)   heparin 900 Units/hr (03/16/23 1121)   methocarbamol (ROBAXIN) IV     ondansetron (ZOFRAN) IV     PRN Meds:.albuterol, alum & mag hydroxide-simeth, bisacodyl, guaiFENesin-dextromethorphan, HYDROmorphone (DILAUDID) injection, magic mouthwash, menthol-cetylpyridinium, methocarbamol (ROBAXIN) IV, naLOXone (NARCAN)  injection, naphazoline-glycerin, ondansetron (ZOFRAN) IV **OR** ondansetron (ZOFRAN) IV, oxyCODONE, phenol, simethicone, sodium chloride   I/O last 3 completed shifts: In: 808.9 [P.O.:760; I.V.:48.9] Out: -  Total I/O In: 500 [P.O.:500] Out: -   Net IO Since  Admission: 1,308.94 mL [03/16/23 1400]   Radiology:   I have reviewed the results.  Cardiac Studies:   Echocardiogram 03/15/2023 1. Left ventricular ejection fraction, by estimation, is 55 to 60%. The left ventricle has normal function. The left ventricle has no regional wall motion abnormalities. Left ventricular diastolic function could not be evaluated.  2. Right ventricular systolic function is normal. The right ventricular size is normal. There is mildly elevated pulmonary artery systolic pressure.  3. Left atrial size was moderately dilated.  4. Right atrial size was moderately dilated.  5. The mitral valve is normal in structure. Mild to moderate mitral valve regurgitation. No evidence of mitral stenosis.  6. The aortic valve is normal in structure. Aortic valve regurgitation is not visualized. No aortic stenosis is present.  7. The inferior vena cava is dilated in size with <50% respiratory variability, suggesting right atrial pressure of 15 mmHg.  EKG:  EKG 03/14/2023: Atrial fibrillation with rapid ventricular response at the rate of 122 bpm.  Poor R progression, probably normal variant.  Nonspecific T abnormality.  Assessment   1.  Blunt trauma to the chest 2.  Left rib fractures 3.  New onset A-fib with RVR 4.  Heavy tobacco use, smoking since age 47 about 1 and half pack to 2 packs of cigarettes. 5.  Aortic atherosclerosis 6.  Mild hypercholesterolemia  Recommendations:   Kristopher [name]  is a 62 y.o. male with medical history significant of cigarette smoking presented to the ED on 03/14/2023 after a fall from a ladder which was approximately 6 feet above the ground.  Patient complained of left-sided rib pain and left hip pain.  Found to have left rib fractures and in A-fib with RVR upon presentation.  Patient's CHA2DS2-VASc rescore is very low however in view of difficulty in controlling the heart rate, we will go and proceed with TEE guided direct-current cardioversion  today.  He is presently on IV heparin, due to small left pneumothorax, will continue this until he is stable from noncardiac reasons then switch him and transition him over to either Eliquis or Xarelto.  I will make further recommendations.  I suspect his A-fib with RVR is related to blunt trauma to the chest and probable cardiac contusion.  Fortunately echocardiogram essentially reveals normal LV systolic function and no wall motion abnormality.  Patient's wife is present, I have discussed with him regarding risk modification, although his lipids were only mildly abnormal in 2018, he does have aortic atherosclerosis by CT scan, although no coronary calcification was mentioned in the CT scan, it may well be worthwhile to start him on a statin therapy.   Kristopher Decamp, MD, [facility] 03/16/2023, 2:00 PM Office: [phone]

## 2023-03-16 NOTE — Progress Notes (Signed)
Progress Note     Subjective: Pt reports overall pain well controlled. Denies SOB, not needing supplemental O2. Smokes, discussed at least temporary cessation and reasoning.   Objective: Vital signs in last 24 hours: Temp:  [97.6 F (36.4 C)-99.5 F (37.5 C)] 99.5 F (37.5 C) (09/13 0700) Pulse Rate:  [67-109] 83 (09/13 0700) Resp:  [13-19] 18 (09/13 0700) BP: (93-110)/(64-85) 110/85 (09/13 0700) SpO2:  [91 %-97 %] 94 % (09/13 0700)    Intake/Output from previous day: 09/12 0701 - 09/13 0700 In: 808.9 [P.O.:760; I.V.:48.9] Out: -  Intake/Output this shift: No intake/output data recorded.  PE: General: pleasant, WD, WN male who is laying in bed in NAD HEENT: head is normocephalic, atraumatic.  Sclera are noninjected.  EOMI Heart: irregularly irregular Lungs: CTAB, no wheezes, rhonchi, or rales noted.  Respiratory effort nonlabored Abd: soft, NT, ND MS: moving all 4 extremities Psych: A&Ox3 with an appropriate affect.    Lab Results:  Recent Labs    03/14/23 1439 03/14/23 1625 03/15/23 0649  WBC 14.1*  --  14.1*  HGB 18.0* 17.3* 15.1  HCT 52.6* 51.0 45.4  PLT 312  --  268   BMET Recent Labs    03/14/23 1653 03/15/23 0649  NA 136 133*  K 5.0 4.6  CL 100 102  CO2 25 25  GLUCOSE 109* 129*  BUN 15 15  CREATININE 0.97 0.69  CALCIUM 9.7 9.1   PT/INR No results for input(s): "LABPROT", "INR" in the last 72 hours. CMP     Component Value Date/Time   NA 133 (L) 03/15/2023 0649   K 4.6 03/15/2023 0649   CL 102 03/15/2023 0649   CO2 25 03/15/2023 0649   GLUCOSE 129 (H) 03/15/2023 0649   BUN 15 03/15/2023 0649   CREATININE 0.69 03/15/2023 0649   CREATININE 0.69 (L) 07/29/2016 1201   CALCIUM 9.1 03/15/2023 0649   PROT 7.9 03/14/2023 1653   ALBUMIN 4.7 03/14/2023 1653   AST 31 03/14/2023 1653   ALT 28 03/14/2023 1653   ALKPHOS 83 03/14/2023 1653   BILITOT 1.0 03/14/2023 1653   GFRNONAA >60 03/15/2023 0649   GFRNONAA >89 07/29/2016 1201   GFRAA >89  07/29/2016 1201   Lipase  No results found for: "LIPASE"     Studies/Results: DG CHEST PORT 1 VIEW  Result Date: 03/16/2023 CLINICAL DATA:  Fall from ladder with left rib fracture. EXAM: PORTABLE CHEST 1 VIEW COMPARISON:  03/15/2023 FINDINGS: Interval decrease in left apical pneumothorax. Interval increase in retrocardiac opacity, likely atelectasis. Interstitial markings are diffusely coarsened with chronic features. No substantial pleural effusion. Posterolateral inferior left rib fractures again noted. Bones are diffusely demineralized. Telemetry leads overlie the chest. IMPRESSION: 1. Interval decrease in left apical pneumothorax. 2. Interval increase in retrocardiac opacity, likely atelectasis. Electronically Signed   By: Kennith Center M.D.   On: 03/16/2023 07:57   ECHOCARDIOGRAM COMPLETE  Result Date: 03/15/2023    ECHOCARDIOGRAM REPORT   Patient Name:   Kristopher [name] [facility] Date of Exam: 03/15/2023 Medical Rec #:  [id number]        Height:       71.0 in Accession #:    [id number]       Weight:       137.0 lb Date of Birth:  [birth year]         BSA:          1.795 m Patient Age:    62 years  BP:           108/73 mmHg Patient Gender: M                HR:           98 bpm. Exam Location:  Inpatient Procedure: 2D Echo, Cardiac Doppler and Color Doppler Indications:    Atrial Fibrillation I48.91  History:        Patient has no prior history of Echocardiogram examinations.                 Arrythmias:Atrial Fibrillation.  Sonographer:    Lucendia Herrlich Referring Phys: [id number] VASUNDHRA RATHORE IMPRESSIONS  1. Left ventricular ejection fraction, by estimation, is 55 to 60%. The left ventricle has normal function. The left ventricle has no regional wall motion abnormalities. Left ventricular diastolic function could not be evaluated.  2. Right ventricular systolic function is normal. The right ventricular size is normal. There is mildly elevated pulmonary artery systolic pressure.  3. Left atrial size  was moderately dilated.  4. Right atrial size was moderately dilated.  5. The mitral valve is normal in structure. Mild to moderate mitral valve regurgitation. No evidence of mitral stenosis.  6. The aortic valve is normal in structure. Aortic valve regurgitation is not visualized. No aortic stenosis is present.  7. The inferior vena cava is dilated in size with <50% respiratory variability, suggesting right atrial pressure of 15 mmHg. FINDINGS  Left Ventricle: Left ventricular ejection fraction, by estimation, is 55 to 60%. The left ventricle has normal function. The left ventricle has no regional wall motion abnormalities. The left ventricular internal cavity size was normal in size. There is  no left ventricular hypertrophy. Left ventricular diastolic function could not be evaluated due to atrial fibrillation. Left ventricular diastolic function could not be evaluated. Right Ventricle: The right ventricular size is normal. No increase in right ventricular wall thickness. Right ventricular systolic function is normal. There is mildly elevated pulmonary artery systolic pressure. The tricuspid regurgitant velocity is 2.71  m/s, and with an assumed right atrial pressure of 15 mmHg, the estimated right ventricular systolic pressure is 44.4 mmHg. Left Atrium: Left atrial size was moderately dilated. Right Atrium: Right atrial size was moderately dilated. Pericardium: There is no evidence of pericardial effusion. Mitral Valve: The mitral valve is normal in structure. Mild to moderate mitral valve regurgitation. No evidence of mitral valve stenosis. Tricuspid Valve: The tricuspid valve is normal in structure. Tricuspid valve regurgitation is mild . No evidence of tricuspid stenosis. Aortic Valve: The aortic valve is normal in structure. Aortic valve regurgitation is not visualized. No aortic stenosis is present. Aortic valve peak gradient measures 6.7 mmHg. Pulmonic Valve: The pulmonic valve was normal in structure.  Pulmonic valve regurgitation is not visualized. No evidence of pulmonic stenosis. Aorta: The aortic root is normal in size and structure. Venous: The inferior vena cava is dilated in size with less than 50% respiratory variability, suggesting right atrial pressure of 15 mmHg. IAS/Shunts: No atrial level shunt detected by color flow Doppler.  LEFT VENTRICLE PLAX 2D LVIDd:         4.30 cm   Diastology LVIDs:         3.10 cm   LV e' medial:    9.95 cm/s LV PW:         1.10 cm   LV E/e' medial:  14.6 LV IVS:        1.10 cm   LV e' lateral:  10.70 cm/s LVOT diam:     2.10 cm   LV E/e' lateral: 13.6 LV SV:         52 LV SV Index:   29 LVOT Area:     3.46 cm  RIGHT VENTRICLE             IVC RV S prime:     12.90 cm/s  IVC diam: 2.70 cm TAPSE (M-mode): 1.4 cm LEFT ATRIUM              Index        RIGHT ATRIUM           Index LA diam:        4.90 cm  2.73 cm/m   RA Area:     20.90 cm LA Vol (A2C):   67.3 ml  37.49 ml/m  RA Volume:   57.60 ml  32.08 ml/m LA Vol (A4C):   107.0 ml 59.60 ml/m LA Biplane Vol: 85.5 ml  47.62 ml/m  AORTIC VALVE AV Area (Vmax): 2.48 cm AV Vmax:        129.00 cm/s AV Peak Grad:   6.7 mmHg LVOT Vmax:      92.47 cm/s LVOT Vmean:     60.667 cm/s LVOT VTI:       0.150 m  AORTA Ao Root diam: 3.60 cm Ao Asc diam:  3.00 cm MITRAL VALVE                TRICUSPID VALVE MV Area (PHT): 5.31 cm     TR Peak grad:   29.4 mmHg MV Decel Time: 143 msec     TR Vmax:        271.00 cm/s MR Peak grad: 73.6 mmHg MR Vmax:      429.00 cm/s   SHUNTS MV E velocity: 145.00 cm/s  Systemic VTI:  0.15 m                             Systemic Diam: 2.10 cm Arvilla Meres MD Electronically signed by Arvilla Meres MD Signature Date/Time: 03/15/2023/4:20:33 PM    Final    DG CHEST PORT 1 VIEW  Result Date: 03/15/2023 CLINICAL DATA:  [id number] Pneumothorax [id number] EXAM: PORTABLE CHEST 1 VIEW.  Patient is rotated. COMPARISON:  Chest x-ray 03/14/2023 10:04 p.m., CT abdomen pelvis 03/14/2023 FINDINGS: The heart and mediastinal  contours are unchanged. Atherosclerotic plaque. Asymmetric prominence of the right hilar vasculature likely due to patient rotation positioning. Right lower lobe vague airspace opacity. Interval increase in interstitial markings. No pleural effusion. Similar-appearing small left apical pneumothorax. Redemonstration of acute displaced left rib fractures. IMPRESSION: 1. Similar-appearing small volume left apical pneumothorax. 2. Right lower lobe vague airspace opacity suggestive of developing pulmonary contusion. 3. Mild pulmonary edema. 4. Redemonstration of acute displaced left rib fractures. Electronically Signed   By: Tish Frederickson M.D.   On: 03/15/2023 05:57   DG Chest 2 View  Result Date: 03/14/2023 CLINICAL DATA:  Larey Seat off ladder pneumothorax EXAM: CHEST - 2 VIEW COMPARISON:  CT 03/14/2023, chest x-ray 03/14/2023 FINDINGS: Negative for pleural effusion. Small left apical pneumothorax demonstrating about 2.8 cm pleural-parenchymal separation at the apex compared with 1.8 cm on prior radiograph. No midline shift. Acute displaced left seventh and eighth and ninth rib fractures. IMPRESSION: 1. Small left apical pneumothorax, slightly increased in size compared to prior radiograph. 2. Acute displaced left seventh, eighth, and ninth rib fractures. Electronically Signed   By:  Jasmine Pang M.D.   On: 03/14/2023 22:17   CT CHEST ABDOMEN PELVIS W CONTRAST  Result Date: 03/14/2023 CLINICAL DATA:  Fall off ladder. EXAM: CT CHEST, ABDOMEN, AND PELVIS WITH CONTRAST TECHNIQUE: Multidetector CT imaging of the chest, abdomen and pelvis was performed following the standard protocol during bolus administration of intravenous contrast. RADIATION DOSE REDUCTION: This exam was performed according to the departmental dose-optimization program which includes automated exposure control, adjustment of the mA and/or kV according to patient size and/or use of iterative reconstruction technique. CONTRAST:  OMNIPAQUE  IOHEXOL 300 MG/ML  SOLN COMPARISON:  None Available. FINDINGS: CT CHEST FINDINGS Cardiovascular: No significant vascular findings. Normal heart size. No pericardial effusion. Mediastinum/Nodes: No enlarged mediastinal, hilar, or axillary lymph nodes. Thyroid gland, trachea, and esophagus demonstrate no significant findings. Lungs/Pleura: Small left apical and anterior pneumothorax is noted as described on prior radiograph. No effusion is noted. No consolidation is noted. Musculoskeletal: Mildly displaced fractures are seen involving the lateral portions of the left seventh, eighth and ninth ribs. CT ABDOMEN PELVIS FINDINGS Hepatobiliary: No cholelithiasis or biliary dilatation is noted. Multiple small calcifications are seen throughout hepatic parenchyma suggesting old inflammation. Pancreas: Unremarkable. No pancreatic ductal dilatation or surrounding inflammatory changes. Spleen: Multiple calcified granulomas are noted. Adrenals/Urinary Tract: Adrenal glands are unremarkable. Kidneys are normal, without renal calculi, focal lesion, or hydronephrosis. Bladder is unremarkable. Stomach/Bowel: Stomach is within normal limits. Appendix appears normal. No evidence of bowel wall thickening, distention, or inflammatory changes. Sigmoid diverticulosis is noted without inflammation. Vascular/Lymphatic: Aortic atherosclerosis. No enlarged abdominal or pelvic lymph nodes. Reproductive: Prostate is unremarkable. Other: No abdominal wall hernia or abnormality. No abdominopelvic ascites. Musculoskeletal: No acute or significant osseous findings. IMPRESSION: Small left-sided pneumothorax is noted as described on prior chest radiograph. Mildly displaced left seventh, eighth and ninth rib fractures. No definite traumatic injury seen in the abdomen or pelvis. Sigmoid diverticulosis without inflammation. Aortic Atherosclerosis (ICD10-I70.0). Electronically Signed   By: Lupita Raider M.D.   On: 03/14/2023 18:38   DG Chest Portable  1 View  Result Date: 03/14/2023 CLINICAL DATA:  Left rib pain after fall off ladder. EXAM: PORTABLE CHEST 1 VIEW COMPARISON:  July 31, 2016. FINDINGS: The heart size and mediastinal contours are within normal limits. Small left apical pneumothorax is noted. Mildly displaced left seventh and eighth rib fractures are noted. No consolidative process is noted. IMPRESSION: Mildly displaced left seventh and eighth rib fractures. Small left apical pneumothorax. Critical Value/emergent results were called by telephone at the time of interpretation on 03/14/2023 at 4:27 pm to provider Dr. Jearld Fenton, who verbally acknowledged these results. Electronically Signed   By: Lupita Raider M.D.   On: 03/14/2023 16:27   DG Hip Unilat W or Wo Pelvis 2-3 Views Left  Result Date: 03/14/2023 CLINICAL DATA:  Fall off ladder. EXAM: DG HIP (WITH OR WITHOUT PELVIS) 2-3V LEFT COMPARISON:  None Available. FINDINGS: There is no evidence of hip fracture or dislocation. There is no evidence of arthropathy or other focal bone abnormality. IMPRESSION: Negative. Electronically Signed   By: Lupita Raider M.D.   On: 03/14/2023 16:22    Anti-infectives: Anti-infectives (From admission, onward)    None        Assessment/Plan  Fall off ladder Rib 7-9 fxs with small PTX - CXR this AM with improvement in left PTX, Continue IS and pulm toilet, advised smoking cessation  - continue multimodal pain control for rib fractures, pt reports good pain control currently  -  follow up with PCP in 1-2 weeks from hospital DC to assess pain control  A fib with RVR - per primary, on dilt gtt  No other recommendations from trauma standpoint. We will sign off at this time. Please call if questions or concerns  FEN: NPO this AM, IVF per primary  VTE: hep gtt ID: no current abx  LOS: 2 days   I reviewed nursing notes, hospitalist notes, last 24 h vitals and pain scores, last 48 h intake and output, last 24 h labs and trends, and last 24 h  imaging results.    Juliet Rude, [facility] 03/16/2023, 10:57 AM Please see Amion for pager number during day hours 7:00am-4:30pm

## 2023-03-16 NOTE — Progress Notes (Addendum)
Mobility Specialist Progress Note:   03/16/23 1010  Mobility  Activity Ambulated independently in hallway  Level of Assistance Modified independent, requires aide device or extra time  Assistive Device Other (Comment) (IV Pole)  Distance Ambulated (ft) 350 ft  Activity Response Tolerated well  Mobility Referral Yes  $Mobility charge 1 Mobility  Mobility Specialist Start Time (ACUTE ONLY) 1010  Mobility Specialist Stop Time (ACUTE ONLY) 1023  Mobility Specialist Time Calculation (min) (ACUTE ONLY) 13 min   Pre-mobility: SpO2 94% RA During mobility: SpO2 94% RA  Pt agreeable to mobility session. Required no physical assistance throughout. Denies SOB, endorses rib pain. VSS on RA throughout. Back in bed with all needs met.   Addison Lank Mobility Specialist Please contact via SecureChat or  Rehab office at [phone]

## 2023-03-16 NOTE — Transfer of Care (Signed)
Immediate Anesthesia Transfer of Care Note  Patient: Shivansh Catlow Cea  Procedure(s) Performed: TRANSESOPHAGEAL ECHOCARDIOGRAM CARDIOVERSION  Patient Location: PACU  Anesthesia Type:MAC  Level of Consciousness: awake, alert , and patient cooperative  Airway & Oxygen Therapy: Patient Spontanous Breathing and Patient connected to face mask oxygen  Post-op Assessment: Report given to RN and Post -op Vital signs reviewed and stable  Post vital signs: Reviewed and stable  Last Vitals:  Vitals Value Taken Time  BP    Temp    Pulse 77 03/16/23 1641  Resp 16 03/16/23 1641  SpO2 96 % 03/16/23 1641  Vitals shown include unfiled device data.  Last Pain:  Vitals:   03/16/23 1547  TempSrc: Temporal  PainSc:          Complications: No notable events documented.

## 2023-03-16 NOTE — Progress Notes (Signed)
ANTICOAGULATION CONSULT NOTE  Pharmacy Consult for Heparin Indication: atrial fibrillation  No Known Allergies  Patient Measurements: Height: 5\' 11"  (180.3 cm) Weight: 62.1 kg (137 lb) IBW/kg (Calculated) : 75.3 Heparin Dosing Weight: 62 kg  Vital Signs: Temp: 99.5 F (37.5 C) (09/13 1640) Temp Source: Temporal (09/13 1640) BP: 127/86 (09/13 1705) Pulse Rate: 83 (09/13 1705)  Labs: Recent Labs    03/14/23 1439 03/14/23 1439 03/14/23 1625 03/14/23 1653 03/15/23 0649 03/16/23 1246 03/16/23 1834  HGB 18.0*  --  17.3*  --  15.1 14.7  --   HCT 52.6*  --  51.0  --  45.4 44.5  --   PLT 312  --   --   --  268 248  --   HEPARINUNFRC  --   --   --   --   --   --  0.14*  CREATININE  --    < > 0.80 0.97 0.69 0.75  --    < > = values in this interval not displayed.    Estimated Creatinine Clearance: 83 mL/min (by C-G formula based on SCr of 0.75 mg/dL).   Medical History: History reviewed. No pertinent past medical history.   Assessment: 62 y.o. male with no PMH who is admitted presented after a fall from a ladder with with left 7 through 9 rib fractures and small left pneumothorax, found to be in A-fib with RVR. Pharmacy consulted to manage heparin. CBC stable, platelets 268.  Initial heparin level low at 0.14  Goal of Therapy:  Heparin level 0.3-0.7 units/ml Monitor platelets by anticoagulation protocol: Yes   Plan:  Increase heparin to 1100 units / hr Follow up AM heparin level, CBC   Thank you Okey Regal, PharmD

## 2023-03-16 NOTE — Progress Notes (Signed)
PROGRESS NOTE        PATIENT DETAILS Name: Kristopher [name] Age: 62 y.o. Sex: male Date of Birth: 10/05/1960 Admit Date: 03/14/2023 Admitting Physician John Giovanni, MD DGU:[id number], No Pcp Per  Brief Summary: Patient is a 62 y.o.  male with?  Prior history of PAF-tobacco use-who fell from a ladder-he was found to have 7-9th left rib fracture-A-fib with RVR and subsequently admitted to the hospitalist service  Significant events: 9/11>> admit to [facility] 9/12>> transferred to [facility] for trauma service evaluation  Significant studies: 9/11>> x-ray left hip: Negative 9/11>> CXR: Left 7th-8th rib fracture-small left apical pneumothorax 9/11>> CT chest: Mildly displaced left seventh, eighth and ninth rib fracture, small left pneumothorax. 9/12>> echo: EF 55-60% 9/12>> TSH: 1.4  Significant microbiology data: None  Procedures: None  Consults: Trauma service  Subjective: Lying comfortably in bed-denies any chest pain or shortness of breath.  Asking about discharge  Objective: Vitals: Blood pressure 110/85, pulse 83, temperature 99.5 F (37.5 C), temperature source Oral, resp. rate 18, height 5\' 11"  (1.803 m), weight 62.1 kg, SpO2 94%.   Exam: Gen Exam:Alert awake-not in any distress HEENT:atraumatic, normocephalic Chest: B/L clear to auscultation anteriorly CVS:S1S2 irregular Abdomen:soft non tender, non distended Extremities:no edema Neurology: Non focal Skin: no rash  Pertinent Labs/Radiology:    Latest Ref Rng & Units 03/15/2023    6:49 AM 03/14/2023    4:25 PM 03/14/2023    2:39 PM  CBC  WBC 4.0 - 10.5 K/uL 14.1   14.1   Hemoglobin 13.0 - 17.0 g/dL 25.9  56.3  87.5   Hematocrit 39.0 - 52.0 % 45.4  51.0  52.6   Platelets 150 - 400 K/uL 268   312     Lab Results  Component Value Date   NA 133 (L) 03/15/2023   K 4.6 03/15/2023   CL 102 03/15/2023   CO2 25 03/15/2023      Assessment/Plan: Left 7th through 9th rib  fractures Small apical left-sided pneumothorax Following a fall from a ladder-6 feet off the ground Mobilize Incentive spirometry Supportive care Await further recommendations from trauma service  PAF with RVR Remains in A-fib-rate controlled on IV Cardizem infusion Starting oral Cardizem Attempted to titrate off Cardizem gtt. CHADS2 Vasc of 0-hence of anticoagulation-but suspect may need a short course of anticoagulation for consideration of cardioversion in the next several weeks if he remains in A-fib. Will discuss with cardiology  Macrocytosis B12/folic acid levels pending  Mildly prolonged QTc interval Telemetry monitoring Keep K> 4, Mg> 2  BMI: Estimated body mass index is 19.11 kg/m as calculated from the following:   Height as of this encounter: 5\' 11"  (1.803 m).   Weight as of this encounter: 62.1 kg.   Code status:   Code Status: Full Code   DVT Prophylaxis: SCDs Start: 03/15/23 0005   Family Communication: None at bedside   Disposition Plan: Status is: Inpatient Remains inpatient appropriate because: Severity of illness   Planned Discharge Destination:Home   Diet: Diet Order             Diet Heart Room service appropriate? Yes; Fluid consistency: Thin  Diet effective now                     Antimicrobial agents: Anti-infectives (From admission, onward)    None  MEDICATIONS: Scheduled Meds:  acetaminophen  1,000 mg Oral Q6H   diltiazem  30 mg Oral Q6H   gabapentin  300 mg Oral TID   ibuprofen  800 mg Oral TID   lidocaine  1 patch Transdermal Q24H   methocarbamol  1,000 mg Oral TID   nicotine  14 mg Transdermal Daily   psyllium  1 packet Oral BID   Continuous Infusions:  diltiazem (CARDIZEM) infusion 5 mg/hr (03/16/23 0020)   methocarbamol (ROBAXIN) IV     ondansetron (ZOFRAN) IV     PRN Meds:.albuterol, alum & mag hydroxide-simeth, bisacodyl, guaiFENesin-dextromethorphan, HYDROmorphone (DILAUDID) injection, magic  mouthwash, menthol-cetylpyridinium, methocarbamol (ROBAXIN) IV, naLOXone (NARCAN)  injection, naphazoline-glycerin, ondansetron (ZOFRAN) IV **OR** ondansetron (ZOFRAN) IV, oxyCODONE, phenol, simethicone, sodium chloride   I have personally reviewed following labs and imaging studies  LABORATORY DATA: CBC: Recent Labs  Lab 03/14/23 1439 03/14/23 1625 03/15/23 0649  WBC 14.1*  --  14.1*  HGB 18.0* 17.3* 15.1  HCT 52.6* 51.0 45.4  MCV 101.5*  --  106.3*  PLT 312  --  268    Basic Metabolic Panel: Recent Labs  Lab 03/14/23 1625 03/14/23 1653 03/15/23 0044 03/15/23 0649  NA 138 136  --  133*  K 4.6 5.0  --  4.6  CL 103 100  --  102  CO2  --  25  --  25  GLUCOSE 103* 109*  --  129*  BUN 15 15  --  15  CREATININE 0.80 0.97  --  0.69  CALCIUM  --  9.7  --  9.1  MG  --   --  2.1  --     GFR: Estimated Creatinine Clearance: 83 mL/min (by C-G formula based on SCr of 0.69 mg/dL).  Liver Function Tests: Recent Labs  Lab 03/14/23 1653  AST 31  ALT 28  ALKPHOS 83  BILITOT 1.0  PROT 7.9  ALBUMIN 4.7   No results for input(s): "LIPASE", "AMYLASE" in the last 168 hours. No results for input(s): "AMMONIA" in the last 168 hours.  Coagulation Profile: No results for input(s): "INR", "PROTIME" in the last 168 hours.  Cardiac Enzymes: No results for input(s): "CKTOTAL", "CKMB", "CKMBINDEX", "TROPONINI" in the last 168 hours.  BNP (last 3 results) No results for input(s): "PROBNP" in the last 8760 hours.  Lipid Profile: No results for input(s): "CHOL", "HDL", "LDLCALC", "TRIG", "CHOLHDL", "LDLDIRECT" in the last 72 hours.  Thyroid Function Tests: Recent Labs    03/15/23 1332  TSH 1.470    Anemia Panel: No results for input(s): "VITAMINB12", "FOLATE", "FERRITIN", "TIBC", "IRON", "RETICCTPCT" in the last 72 hours.  Urine analysis: No results found for: "COLORURINE", "APPEARANCEUR", "LABSPEC", "PHURINE", "GLUCOSEU", "HGBUR", "BILIRUBINUR", "KETONESUR", "PROTEINUR",  "UROBILINOGEN", "NITRITE", "LEUKOCYTESUR"  Sepsis Labs: Lactic Acid, Venous No results found for: "LATICACIDVEN"  MICROBIOLOGY: No results found for this or any previous visit (from the past 240 hour(s)).  RADIOLOGY STUDIES/RESULTS: DG CHEST PORT 1 VIEW  Result Date: 03/16/2023 CLINICAL DATA:  Fall from ladder with left rib fracture. EXAM: PORTABLE CHEST 1 VIEW COMPARISON:  03/15/2023 FINDINGS: Interval decrease in left apical pneumothorax. Interval increase in retrocardiac opacity, likely atelectasis. Interstitial markings are diffusely coarsened with chronic features. No substantial pleural effusion. Posterolateral inferior left rib fractures again noted. Bones are diffusely demineralized. Telemetry leads overlie the chest. IMPRESSION: 1. Interval decrease in left apical pneumothorax. 2. Interval increase in retrocardiac opacity, likely atelectasis. Electronically Signed   By: Kennith Center M.D.   On: 03/16/2023 07:57  ECHOCARDIOGRAM COMPLETE  Result Date: 03/15/2023    ECHOCARDIOGRAM REPORT   Patient Name:   Kristopher [name] [facility] Date of Exam: 03/15/2023 Medical Rec #:  [id number]        Height:       71.0 in Accession #:    [id number]       Weight:       137.0 lb Date of Birth:  March 31, 1961         BSA:          1.795 m Patient Age:    62 years         BP:           108/73 mmHg Patient Gender: M                HR:           98 bpm. Exam Location:  Inpatient Procedure: 2D Echo, Cardiac Doppler and Color Doppler Indications:    Atrial Fibrillation I48.91  History:        Patient has no prior history of Echocardiogram examinations.                 Arrythmias:Atrial Fibrillation.  Sonographer:    Lucendia Herrlich Referring Phys: [id number] VASUNDHRA RATHORE IMPRESSIONS  1. Left ventricular ejection fraction, by estimation, is 55 to 60%. The left ventricle has normal function. The left ventricle has no regional wall motion abnormalities. Left ventricular diastolic function could not be evaluated.  2. Right  ventricular systolic function is normal. The right ventricular size is normal. There is mildly elevated pulmonary artery systolic pressure.  3. Left atrial size was moderately dilated.  4. Right atrial size was moderately dilated.  5. The mitral valve is normal in structure. Mild to moderate mitral valve regurgitation. No evidence of mitral stenosis.  6. The aortic valve is normal in structure. Aortic valve regurgitation is not visualized. No aortic stenosis is present.  7. The inferior vena cava is dilated in size with <50% respiratory variability, suggesting right atrial pressure of 15 mmHg. FINDINGS  Left Ventricle: Left ventricular ejection fraction, by estimation, is 55 to 60%. The left ventricle has normal function. The left ventricle has no regional wall motion abnormalities. The left ventricular internal cavity size was normal in size. There is  no left ventricular hypertrophy. Left ventricular diastolic function could not be evaluated due to atrial fibrillation. Left ventricular diastolic function could not be evaluated. Right Ventricle: The right ventricular size is normal. No increase in right ventricular wall thickness. Right ventricular systolic function is normal. There is mildly elevated pulmonary artery systolic pressure. The tricuspid regurgitant velocity is 2.71  m/s, and with an assumed right atrial pressure of 15 mmHg, the estimated right ventricular systolic pressure is 44.4 mmHg. Left Atrium: Left atrial size was moderately dilated. Right Atrium: Right atrial size was moderately dilated. Pericardium: There is no evidence of pericardial effusion. Mitral Valve: The mitral valve is normal in structure. Mild to moderate mitral valve regurgitation. No evidence of mitral valve stenosis. Tricuspid Valve: The tricuspid valve is normal in structure. Tricuspid valve regurgitation is mild . No evidence of tricuspid stenosis. Aortic Valve: The aortic valve is normal in structure. Aortic valve regurgitation  is not visualized. No aortic stenosis is present. Aortic valve peak gradient measures 6.7 mmHg. Pulmonic Valve: The pulmonic valve was normal in structure. Pulmonic valve regurgitation is not visualized. No evidence of pulmonic stenosis. Aorta: The aortic root is normal in size and structure. Venous: The inferior  vena cava is dilated in size with less than 50% respiratory variability, suggesting right atrial pressure of 15 mmHg. IAS/Shunts: No atrial level shunt detected by color flow Doppler.  LEFT VENTRICLE PLAX 2D LVIDd:         4.30 cm   Diastology LVIDs:         3.10 cm   LV e' medial:    9.95 cm/s LV PW:         1.10 cm   LV E/e' medial:  14.6 LV IVS:        1.10 cm   LV e' lateral:   10.70 cm/s LVOT diam:     2.10 cm   LV E/e' lateral: 13.6 LV SV:         52 LV SV Index:   29 LVOT Area:     3.46 cm  RIGHT VENTRICLE             IVC RV S prime:     12.90 cm/s  IVC diam: 2.70 cm TAPSE (M-mode): 1.4 cm LEFT ATRIUM              Index        RIGHT ATRIUM           Index LA diam:        4.90 cm  2.73 cm/m   RA Area:     20.90 cm LA Vol (A2C):   67.3 ml  37.49 ml/m  RA Volume:   57.60 ml  32.08 ml/m LA Vol (A4C):   107.0 ml 59.60 ml/m LA Biplane Vol: 85.5 ml  47.62 ml/m  AORTIC VALVE AV Area (Vmax): 2.48 cm AV Vmax:        129.00 cm/s AV Peak Grad:   6.7 mmHg LVOT Vmax:      92.47 cm/s LVOT Vmean:     60.667 cm/s LVOT VTI:       0.150 m  AORTA Ao Root diam: 3.60 cm Ao Asc diam:  3.00 cm MITRAL VALVE                TRICUSPID VALVE MV Area (PHT): 5.31 cm     TR Peak grad:   29.4 mmHg MV Decel Time: 143 msec     TR Vmax:        271.00 cm/s MR Peak grad: 73.6 mmHg MR Vmax:      429.00 cm/s   SHUNTS MV E velocity: 145.00 cm/s  Systemic VTI:  0.15 m                             Systemic Diam: 2.10 cm Arvilla Meres MD Electronically signed by Arvilla Meres MD Signature Date/Time: 03/15/2023/4:20:33 PM    Final    DG CHEST PORT 1 VIEW  Result Date: 03/15/2023 CLINICAL DATA:  [id number] Pneumothorax [id number] EXAM:  PORTABLE CHEST 1 VIEW.  Patient is rotated. COMPARISON:  Chest x-ray 03/14/2023 10:04 p.m., CT abdomen pelvis 03/14/2023 FINDINGS: The heart and mediastinal contours are unchanged. Atherosclerotic plaque. Asymmetric prominence of the right hilar vasculature likely due to patient rotation positioning. Right lower lobe vague airspace opacity. Interval increase in interstitial markings. No pleural effusion. Similar-appearing small left apical pneumothorax. Redemonstration of acute displaced left rib fractures. IMPRESSION: 1. Similar-appearing small volume left apical pneumothorax. 2. Right lower lobe vague airspace opacity suggestive of developing pulmonary contusion. 3. Mild pulmonary edema. 4. Redemonstration of acute displaced left rib fractures. Electronically Signed  By: Tish Frederickson M.D.   On: 03/15/2023 05:57   DG Chest 2 View  Result Date: 03/14/2023 CLINICAL DATA:  Larey Seat off ladder pneumothorax EXAM: CHEST - 2 VIEW COMPARISON:  CT 03/14/2023, chest x-ray 03/14/2023 FINDINGS: Negative for pleural effusion. Small left apical pneumothorax demonstrating about 2.8 cm pleural-parenchymal separation at the apex compared with 1.8 cm on prior radiograph. No midline shift. Acute displaced left seventh and eighth and ninth rib fractures. IMPRESSION: 1. Small left apical pneumothorax, slightly increased in size compared to prior radiograph. 2. Acute displaced left seventh, eighth, and ninth rib fractures. Electronically Signed   By: Jasmine Pang M.D.   On: 03/14/2023 22:17   CT CHEST ABDOMEN PELVIS W CONTRAST  Result Date: 03/14/2023 CLINICAL DATA:  Fall off ladder. EXAM: CT CHEST, ABDOMEN, AND PELVIS WITH CONTRAST TECHNIQUE: Multidetector CT imaging of the chest, abdomen and pelvis was performed following the standard protocol during bolus administration of intravenous contrast. RADIATION DOSE REDUCTION: This exam was performed according to the departmental dose-optimization program which includes automated  exposure control, adjustment of the mA and/or kV according to patient size and/or use of iterative reconstruction technique. CONTRAST:  OMNIPAQUE IOHEXOL 300 MG/ML  SOLN COMPARISON:  None Available. FINDINGS: CT CHEST FINDINGS Cardiovascular: No significant vascular findings. Normal heart size. No pericardial effusion. Mediastinum/Nodes: No enlarged mediastinal, hilar, or axillary lymph nodes. Thyroid gland, trachea, and esophagus demonstrate no significant findings. Lungs/Pleura: Small left apical and anterior pneumothorax is noted as described on prior radiograph. No effusion is noted. No consolidation is noted. Musculoskeletal: Mildly displaced fractures are seen involving the lateral portions of the left seventh, eighth and ninth ribs. CT ABDOMEN PELVIS FINDINGS Hepatobiliary: No cholelithiasis or biliary dilatation is noted. Multiple small calcifications are seen throughout hepatic parenchyma suggesting old inflammation. Pancreas: Unremarkable. No pancreatic ductal dilatation or surrounding inflammatory changes. Spleen: Multiple calcified granulomas are noted. Adrenals/Urinary Tract: Adrenal glands are unremarkable. Kidneys are normal, without renal calculi, focal lesion, or hydronephrosis. Bladder is unremarkable. Stomach/Bowel: Stomach is within normal limits. Appendix appears normal. No evidence of bowel wall thickening, distention, or inflammatory changes. Sigmoid diverticulosis is noted without inflammation. Vascular/Lymphatic: Aortic atherosclerosis. No enlarged abdominal or pelvic lymph nodes. Reproductive: Prostate is unremarkable. Other: No abdominal wall hernia or abnormality. No abdominopelvic ascites. Musculoskeletal: No acute or significant osseous findings. IMPRESSION: Small left-sided pneumothorax is noted as described on prior chest radiograph. Mildly displaced left seventh, eighth and ninth rib fractures. No definite traumatic injury seen in the abdomen or pelvis. Sigmoid diverticulosis  without inflammation. Aortic Atherosclerosis (ICD10-I70.0). Electronically Signed   By: Lupita Raider M.D.   On: 03/14/2023 18:38   DG Chest Portable 1 View  Result Date: 03/14/2023 CLINICAL DATA:  Left rib pain after fall off ladder. EXAM: PORTABLE CHEST 1 VIEW COMPARISON:  July 31, 2016. FINDINGS: The heart size and mediastinal contours are within normal limits. Small left apical pneumothorax is noted. Mildly displaced left seventh and eighth rib fractures are noted. No consolidative process is noted. IMPRESSION: Mildly displaced left seventh and eighth rib fractures. Small left apical pneumothorax. Critical Value/emergent results were called by telephone at the time of interpretation on 03/14/2023 at 4:27 pm to provider Dr. Jearld Fenton, who verbally acknowledged these results. Electronically Signed   By: Lupita Raider M.D.   On: 03/14/2023 16:27   DG Hip Unilat W or Wo Pelvis 2-3 Views Left  Result Date: 03/14/2023 CLINICAL DATA:  Fall off ladder. EXAM: DG HIP (WITH OR WITHOUT PELVIS) 2-3V LEFT  COMPARISON:  None Available. FINDINGS: There is no evidence of hip fracture or dislocation. There is no evidence of arthropathy or other focal bone abnormality. IMPRESSION: Negative. Electronically Signed   By: Lupita Raider M.D.   On: 03/14/2023 16:22     LOS: 2 days   Jeoffrey Massed, MD  [facility]    To contact the attending provider between 7A-7P or the covering provider during after hours 7P-7A, please log into the web site [email or web address] and access using universal [place] password for that web site. If you do not have the password, please call the hospital operator.  03/16/2023, 8:15 AM

## 2023-03-16 NOTE — Anesthesia Postprocedure Evaluation (Signed)
Anesthesia Post Note  Patient: Kristopher [name]  Procedure(s) Performed: TRANSESOPHAGEAL ECHOCARDIOGRAM CARDIOVERSION     Patient location during evaluation: PACU Anesthesia Type: MAC Level of consciousness: awake and alert Pain management: pain level controlled Vital Signs Assessment: post-procedure vital signs reviewed and stable Respiratory status: spontaneous breathing, nonlabored ventilation, respiratory function stable and patient connected to nasal cannula oxygen Cardiovascular status: stable and blood pressure returned to baseline Postop Assessment: no apparent nausea or vomiting Anesthetic complications: no  No notable events documented.  Last Vitals:  Vitals:   03/16/23 1645 03/16/23 1705  BP: 112/77 127/86  Pulse: 83 83  Resp: 16 (!) 23  Temp:    SpO2: 90% 94%    Last Pain:  Vitals:   03/16/23 1705  TempSrc:   PainSc: 0-No pain                 Chantrell Apsey S

## 2023-03-16 NOTE — Evaluation (Signed)
Physical Therapy Evaluation Patient Details Name: Kristopher [name] MRN: [id number] DOB: 13-May-1961 Today's Date: 03/16/2023  History of Present Illness  Pt is 62 yo presenting to [facility] ER  then transfer to [facility] after a fall from a 7 foot ladder. Pt found to have rib fractures on the L 7-9, RVR and apical pneumothorax. PMH: not significant  Clinical Impression  Pt is presenting at baseline level of functioning. Pt is currently ind with all functional activities including stairs per home set up. Due to current level of functioning, home setup and available assistance on discharge no recommended skilled physical therapy services at this time and pt will be discharged from acute care hospital skilled physical therapy services. If further needs arise please re-consult.               Equipment Recommendations None recommended by PT     Functional Status Assessment Patient has not had a recent decline in their functional status     Precautions / Restrictions Precautions Precautions: None Restrictions Weight Bearing Restrictions: No      Mobility  Bed Mobility Overal bed mobility: Independent      Transfers Overall transfer level: Independent Equipment used: None      Ambulation/Gait Ambulation/Gait assistance: Independent Gait Distance (Feet): 225 Feet Assistive device: None Gait Pattern/deviations: WFL(Within Functional Limits) Gait velocity: WNL Gait velocity interpretation: >4.37 ft/sec, indicative of normal walking speed      Stairs Stairs: Yes Stairs assistance: Modified independent (Device/Increase time) Stair Management: One rail Right Number of Stairs: 4 General stair comments: pt is able to navigate stairs per home set up with out difficulty.      Balance Overall balance assessment: Modified Independent             Pertinent Vitals/Pain Pain Assessment Pain Assessment: 0-10 Pain Score: 0-No pain Pain Location: L thoracic area Pain Descriptors /  Indicators: Aching Pain Intervention(s): Monitored during session    Home Living Family/patient expects to be discharged to:: Private residence Living Arrangements: Spouse/significant other Available Help at Discharge: Family;Available 24 hours/day Type of Home: House Home Access: Stairs to enter Entrance Stairs-Rails: Right Entrance Stairs-Number of Steps: 5 to get in through the garage and 7 at the front with bil rails that cannot reach at the same time   Home Layout: One level Home Equipment: Crutches      Prior Function Prior Level of Function : Independent/Modified Independent;Driving             Mobility Comments: Pt is independent ADLs Comments: Pt is independent with all ADLs.     Extremity/Trunk Assessment   Upper Extremity Assessment Upper Extremity Assessment: Overall WFL for tasks assessed    Lower Extremity Assessment Lower Extremity Assessment: Overall WFL for tasks assessed    Cervical / Trunk Assessment Cervical / Trunk Assessment: Normal  Communication   Communication Communication: No apparent difficulties  Cognition Arousal: Alert Behavior During Therapy: WFL for tasks assessed/performed Overall Cognitive Status: Within Functional Limits for tasks assessed        General Comments General comments (skin integrity, edema, etc.): Spouse present in room during session.        Assessment/Plan    PT Assessment Patient does not need any further PT services         PT Goals (Current goals can be found in the Care Plan section)  Acute Rehab PT Goals PT Goal Formulation: All assessment and education complete, DC therapy     AM-PAC PT "6 Clicks" Mobility  Outcome Measure Help needed turning from your back to your side while in a flat bed without using bedrails?: None Help needed moving from lying on your back to sitting on the side of a flat bed without using bedrails?: None Help needed moving to and from a bed to a chair (including a  wheelchair)?: None Help needed standing up from a chair using your arms (e.g., wheelchair or bedside chair)?: None Help needed to walk in hospital room?: None Help needed climbing 3-5 steps with a railing? : None 6 Click Score: 24    End of Session Equipment Utilized During Treatment: Gait belt Activity Tolerance: Patient tolerated treatment well Patient left: in bed;with call bell/phone within reach Nurse Communication: Mobility status      Time: 1202-1215 PT Time Calculation (min) (ACUTE ONLY): 13 min   Charges:   PT Evaluation $PT Eval Low Complexity: 1 Low   PT General Charges $$ ACUTE PT VISIT: 1 Visit        Harrel Carina, DPT, CLT  Acute Rehabilitation Services Office: [phone] (Secure chat preferred)   Claudia Desanctis 03/16/2023, 12:20 PM

## 2023-03-16 NOTE — H&P (View-Only) (Signed)
CARDIOLOGY CONSULT NOTE  Patient ID: PABLITO PEPIN MRN: [id number] DOB/AGE: 08/28/1960 62 y.o.  Admit date: 03/14/2023 Referring Physician  Jeoffrey Massed, MD Primary Physician:  Patient, No Pcp Per Reason for Consultation  A. Fib with RVR  Patient ID: Kristopher [name], male    DOB: 1961-04-17, 62 y.o.   MRN: [id number]  Chief Complaint  Patient presents with   Fall        HPI:    Kristopher [name]  is a 62 y.o. male with medical history significant of cigarette smoking presented to the ED on 03/14/2023 after a fall from a ladder which was approximately 6 feet above the ground.  Patient complained of left-sided rib pain and left hip pain.  He has been diagnosed with left 7 through 9 rib fractures and small left pneumothorax.  In the ED he was found to be in A-fib with RVR and was started on diltiazem drip.  Since he has been in persistent atrial fibrillation with RVR 2 days later, I was consulted to further manage and evaluate.  Patient presently not having any active chest pain or feeling of palpitations or dyspnea.  He has never had any cardiac workup.  Smokes about 1.5 packs of cigarettes a day.  His wife is present.  States that he was able to walk 5 to 6 miles a day until 3 months ago when he retired.  He still remains active.  Denies symptoms of chest pain, angina, dyspnea or claudication.  He also drinks about 6-8 beers a day.  History reviewed. No pertinent past medical history. Past Surgical History:  Procedure Laterality Date   GANGLION CYST EXCISION     Social History   Tobacco Use   Smoking status: Every Day    Current packs/day: 1.00    Average packs/day: 1 pack/day for 40.0 years (40.0 ttl pk-yrs)    Types: Cigarettes   Smokeless tobacco: Never  Substance Use Topics   Alcohol use: No    Family History  Problem Relation Age of Onset   Stroke Mother    Heart disease Father    Heart attack Father    Diabetes Sister    Diabetes Brother     Marital Status:  Single  ROS  Review of Systems  Cardiovascular:  Positive for chest pain (on deep breath left chest). Negative for dyspnea on exertion and leg swelling.   Objective      03/16/2023   12:00 PM 03/16/2023   11:33 AM 03/16/2023   11:18 AM  Vitals with BMI  Systolic 116 117 782  Diastolic 77 81 78  Pulse 97 89     Blood pressure 116/77, pulse 97, temperature 97.9 F (36.6 C), temperature source Oral, resp. rate 19, height 5\' 11"  (1.803 m), weight 62.1 kg, SpO2 91%.    Physical Exam Neck:     Vascular: No carotid bruit or JVD.  Cardiovascular:     Rate and Rhythm: Tachycardia present. Rhythm irregular.     Pulses: Normal pulses and intact distal pulses.     Heart sounds: No murmur heard. Pulmonary:     Effort: Pulmonary effort is normal.     Breath sounds: Normal breath sounds.  Abdominal:     General: Bowel sounds are normal.     Palpations: Abdomen is soft.  Musculoskeletal:     Right lower leg: No edema.     Left lower leg: No edema.  Skin:    Capillary Refill: Capillary refill takes less than  2 seconds.    Laboratory examination:   Recent Labs    03/14/23 1653 03/15/23 0649 03/16/23 1246  NA 136 133* 136  K 5.0 4.6 4.2  CL 100 102 98  CO2 25 25 27   GLUCOSE 109* 129* 87  BUN 15 15 13   CREATININE 0.97 0.69 0.75  CALCIUM 9.7 9.1 9.1  GFRNONAA >60 >60 >60   estimated creatinine clearance is 83 mL/min (by C-G formula based on SCr of 0.75 mg/dL).     Latest Ref Rng & Units 03/16/2023   12:46 PM 03/15/2023    6:49 AM 03/14/2023    4:53 PM  CMP  Glucose 70 - 99 mg/dL 87  621  308   BUN 8 - 23 mg/dL 13  15  15    Creatinine 0.61 - 1.24 mg/dL 6.57  8.46  9.62   Sodium 135 - 145 mmol/L 136  133  136   Potassium 3.5 - 5.1 mmol/L 4.2  4.6  5.0   Chloride 98 - 111 mmol/L 98  102  100   CO2 22 - 32 mmol/L 27  25  25    Calcium 8.9 - 10.3 mg/dL 9.1  9.1  9.7   Total Protein 6.5 - 8.1 g/dL   7.9   Total Bilirubin 0.3 - 1.2 mg/dL   1.0   Alkaline Phos 38 - 126 U/L   83    AST 15 - 41 U/L   31   ALT 0 - 44 U/L   28       Latest Ref Rng & Units 03/16/2023   12:46 PM 03/15/2023    6:49 AM 03/14/2023    4:25 PM  CBC  WBC 4.0 - 10.5 K/uL 13.4  14.1    Hemoglobin 13.0 - 17.0 g/dL 95.2  84.1  32.4   Hematocrit 39.0 - 52.0 % 44.5  45.4  51.0   Platelets 150 - 400 K/uL 248  268     Lipid Panel No results for input(s): "CHOL", "TRIG", "LDLCALC", "VLDL", "HDL", "CHOLHDL", "LDLDIRECT" in the last 8760 hours.  HEMOGLOBIN A1C No results found for: "HGBA1C", "MPG" TSH Recent Labs    03/15/23 1332  TSH 1.470   BNP (last 3 results) No results for input(s): "BNP" in the last 8760 hours. Cardiac Panel (last 3 results) No results for input(s): "CKTOTAL", "CKMB", "TROPONINIHS", "RELINDX" in the last 72 hours.   Medications and allergies  No Known Allergies   Current Meds  Medication Sig   nicotine polacrilex (NICORETTE) 4 MG gum Take 4 mg by mouth as needed for smoking cessation.    Scheduled Meds:  acetaminophen  1,000 mg Oral Q6H   diltiazem  30 mg Oral Q6H   gabapentin  300 mg Oral TID   ibuprofen  800 mg Oral TID   lidocaine  1 patch Transdermal Q24H   methocarbamol  1,000 mg Oral TID   nicotine  14 mg Transdermal Daily   psyllium  1 packet Oral BID   Continuous Infusions:  sodium chloride Stopped (03/16/23 1133)   diltiazem (CARDIZEM) infusion Stopped (03/16/23 0935)   heparin 900 Units/hr (03/16/23 1121)   methocarbamol (ROBAXIN) IV     ondansetron (ZOFRAN) IV     PRN Meds:.albuterol, alum & mag hydroxide-simeth, bisacodyl, guaiFENesin-dextromethorphan, HYDROmorphone (DILAUDID) injection, magic mouthwash, menthol-cetylpyridinium, methocarbamol (ROBAXIN) IV, naLOXone (NARCAN)  injection, naphazoline-glycerin, ondansetron (ZOFRAN) IV **OR** ondansetron (ZOFRAN) IV, oxyCODONE, phenol, simethicone, sodium chloride   I/O last 3 completed shifts: In: 808.9 [P.O.:760; I.V.:48.9] Out: -  Total I/O In: 500 [P.O.:500] Out: -   Net IO Since  Admission: 1,308.94 mL [03/16/23 1400]   Radiology:   I have reviewed the results.  Cardiac Studies:   Echocardiogram 03/15/2023 1. Left ventricular ejection fraction, by estimation, is 55 to 60%. The left ventricle has normal function. The left ventricle has no regional wall motion abnormalities. Left ventricular diastolic function could not be evaluated.  2. Right ventricular systolic function is normal. The right ventricular size is normal. There is mildly elevated pulmonary artery systolic pressure.  3. Left atrial size was moderately dilated.  4. Right atrial size was moderately dilated.  5. The mitral valve is normal in structure. Mild to moderate mitral valve regurgitation. No evidence of mitral stenosis.  6. The aortic valve is normal in structure. Aortic valve regurgitation is not visualized. No aortic stenosis is present.  7. The inferior vena cava is dilated in size with <50% respiratory variability, suggesting right atrial pressure of 15 mmHg.  EKG:  EKG 03/14/2023: Atrial fibrillation with rapid ventricular response at the rate of 122 bpm.  Poor R progression, probably normal variant.  Nonspecific T abnormality.  Assessment   1.  Blunt trauma to the chest 2.  Left rib fractures 3.  New onset A-fib with RVR 4.  Heavy tobacco use, smoking since age 47 about 1 and half pack to 2 packs of cigarettes. 5.  Aortic atherosclerosis 6.  Mild hypercholesterolemia  Recommendations:   Kristopher [name]  is a 62 y.o. male with medical history significant of cigarette smoking presented to the ED on 03/14/2023 after a fall from a ladder which was approximately 6 feet above the ground.  Patient complained of left-sided rib pain and left hip pain.  Found to have left rib fractures and in A-fib with RVR upon presentation.  Patient's CHA2DS2-VASc rescore is very low however in view of difficulty in controlling the heart rate, we will go and proceed with TEE guided direct-current cardioversion  today.  He is presently on IV heparin, due to small left pneumothorax, will continue this until he is stable from noncardiac reasons then switch him and transition him over to either Eliquis or Xarelto.  I will make further recommendations.  I suspect his A-fib with RVR is related to blunt trauma to the chest and probable cardiac contusion.  Fortunately echocardiogram essentially reveals normal LV systolic function and no wall motion abnormality.  Patient's wife is present, I have discussed with him regarding risk modification, although his lipids were only mildly abnormal in 2018, he does have aortic atherosclerosis by CT scan, although no coronary calcification was mentioned in the CT scan, it may well be worthwhile to start him on a statin therapy.   Yates Decamp, MD, [facility] 03/16/2023, 2:00 PM Office: [phone]

## 2023-03-16 NOTE — Anesthesia Preprocedure Evaluation (Signed)
Anesthesia Evaluation  Patient identified by MRN, date of birth, ID band Patient awake    Reviewed: Allergy & Precautions, H&P , NPO status , Patient's Chart, lab work & pertinent test results  Airway Mallampati: II  TM Distance: >3 FB Neck ROM: Full    Dental no notable dental hx.    Pulmonary Current Smoker   Pulmonary exam normal breath sounds clear to auscultation       Cardiovascular + dysrhythmias Atrial Fibrillation  Rhythm:Irregular Rate:Tachycardia  1. Left ventricular ejection fraction, by estimation, is 55 to 60%. The  left ventricle has normal function. The left ventricle has no regional  wall motion abnormalities. Left ventricular diastolic function could not  be evaluated.   2. Right ventricular systolic function is normal. The right ventricular  size is normal. There is mildly elevated pulmonary artery systolic  pressure.   3. Left atrial size was moderately dilated.   4. Right atrial size was moderately dilated.   5. The mitral valve is normal in structure. Mild to moderate mitral valve  regurgitation. No evidence of mitral stenosis.   6. The aortic valve is normal in structure. Aortic valve regurgitation is  not visualized. No aortic stenosis is present.   7. The inferior vena cava is dilated in size with <50% respiratory  variability, suggesting right atrial pressure of 15 mmHg.     Neuro/Psych negative neurological ROS  negative psych ROS   GI/Hepatic negative GI ROS, Neg liver ROS,,,  Endo/Other  negative endocrine ROS    Renal/GU negative Renal ROS  negative genitourinary   Musculoskeletal negative musculoskeletal ROS (+)    Abdominal   Peds negative pediatric ROS (+)  Hematology negative hematology ROS (+)   Anesthesia Other Findings   Reproductive/Obstetrics negative OB ROS                             Anesthesia Physical Anesthesia Plan  ASA: 3  Anesthesia  Plan: MAC   Post-op Pain Management:    Induction: Intravenous  PONV Risk Score and Plan: 1 and Propofol infusion and Treatment may vary due to age or medical condition  Airway Management Planned: Simple Face Mask  Additional Equipment:   Intra-op Plan:   Post-operative Plan:   Informed Consent: I have reviewed the patients History and Physical, chart, labs and discussed the procedure including the risks, benefits and alternatives for the proposed anesthesia with the patient or authorized representative who has indicated his/her understanding and acceptance.     Dental advisory given  Plan Discussed with: CRNA and Surgeon  Anesthesia Plan Comments:        Anesthesia Quick Evaluation

## 2023-03-17 ENCOUNTER — Inpatient Hospital Stay (HOSPITAL_COMMUNITY): Payer: 59

## 2023-03-17 DIAGNOSIS — I4891 Unspecified atrial fibrillation: Secondary | ICD-10-CM | POA: Diagnosis not present

## 2023-03-17 DIAGNOSIS — D7589 Other specified diseases of blood and blood-forming organs: Secondary | ICD-10-CM | POA: Diagnosis not present

## 2023-03-17 DIAGNOSIS — J939 Pneumothorax, unspecified: Secondary | ICD-10-CM | POA: Diagnosis not present

## 2023-03-17 DIAGNOSIS — S2242XK Multiple fractures of ribs, left side, subsequent encounter for fracture with nonunion: Secondary | ICD-10-CM | POA: Diagnosis not present

## 2023-03-17 LAB — HEPARIN LEVEL (UNFRACTIONATED)
Heparin Unfractionated: 0.18 [IU]/mL — ABNORMAL LOW (ref 0.30–0.70)
Heparin Unfractionated: 0.1 [IU]/mL — ABNORMAL LOW (ref 0.30–0.70)
Heparin Unfractionated: 0.3 [IU]/mL (ref 0.30–0.70)

## 2023-03-17 LAB — CBC WITH DIFFERENTIAL/PLATELET
Abs Immature Granulocytes: 0.08 10*3/uL — ABNORMAL HIGH (ref 0.00–0.07)
Basophils Absolute: 0.1 10*3/uL (ref 0.0–0.1)
Basophils Relative: 1 %
Eosinophils Absolute: 0.4 10*3/uL (ref 0.0–0.5)
Eosinophils Relative: 3 %
HCT: 42.3 % (ref 39.0–52.0)
Hemoglobin: 14.2 g/dL (ref 13.0–17.0)
Immature Granulocytes: 1 %
Lymphocytes Relative: 7 %
Lymphs Abs: 0.9 10*3/uL (ref 0.7–4.0)
MCH: 34.5 pg — ABNORMAL HIGH (ref 26.0–34.0)
MCHC: 33.6 g/dL (ref 30.0–36.0)
MCV: 102.9 fL — ABNORMAL HIGH (ref 80.0–100.0)
Monocytes Absolute: 1.5 10*3/uL — ABNORMAL HIGH (ref 0.1–1.0)
Monocytes Relative: 11 %
Neutro Abs: 10.9 10*3/uL — ABNORMAL HIGH (ref 1.7–7.7)
Neutrophils Relative %: 77 %
Platelets: 233 10*3/uL (ref 150–400)
RBC: 4.11 MIL/uL — ABNORMAL LOW (ref 4.22–5.81)
RDW: 12.1 % (ref 11.5–15.5)
WBC: 13.8 10*3/uL — ABNORMAL HIGH (ref 4.0–10.5)
nRBC: 0 % (ref 0.0–0.2)

## 2023-03-17 LAB — BASIC METABOLIC PANEL
Anion gap: 11 (ref 5–15)
BUN: 13 mg/dL (ref 8–23)
CO2: 27 mmol/L (ref 22–32)
Calcium: 8.8 mg/dL — ABNORMAL LOW (ref 8.9–10.3)
Chloride: 99 mmol/L (ref 98–111)
Creatinine, Ser: 0.75 mg/dL (ref 0.61–1.24)
GFR, Estimated: >60 mL/min (ref >60)
Glucose, Bld: 96 mg/dL (ref 70–99)
Potassium: 3.9 mmol/L (ref 3.5–5.1)
Sodium: 137 mmol/L (ref 135–145)

## 2023-03-17 LAB — MAGNESIUM: Magnesium: 1.8 mg/dL (ref 1.7–2.4)

## 2023-03-17 MED ORDER — DILTIAZEM HCL ER COATED BEADS 120 MG PO CP24
120.0000 mg | ORAL_CAPSULE | Freq: Every day | ORAL | Status: DC
  Administered 2023-03-17 – 2023-03-18 (×2): 120 mg via ORAL
  Filled 2023-03-17 (×2): qty 1

## 2023-03-17 MED ORDER — GUAIFENESIN ER 600 MG PO TB12
600.0000 mg | ORAL_TABLET | Freq: Two times a day (BID) | ORAL | Status: DC
  Administered 2023-03-17 – 2023-03-19 (×5): 600 mg via ORAL
  Filled 2023-03-17 (×5): qty 1

## 2023-03-17 MED ORDER — IPRATROPIUM-ALBUTEROL 0.5-2.5 (3) MG/3ML IN SOLN
3.0000 mL | Freq: Four times a day (QID) | RESPIRATORY_TRACT | Status: DC
  Administered 2023-03-17 – 2023-03-18 (×5): 3 mL via RESPIRATORY_TRACT
  Filled 2023-03-17 (×8): qty 3

## 2023-03-17 MED ORDER — AMOXICILLIN-POT CLAVULANATE 875-125 MG PO TABS
1.0000 | ORAL_TABLET | Freq: Two times a day (BID) | ORAL | Status: DC
  Administered 2023-03-17 – 2023-03-19 (×5): 1 via ORAL
  Filled 2023-03-17 (×5): qty 1

## 2023-03-17 NOTE — Progress Notes (Signed)
ANTICOAGULATION CONSULT NOTE  Pharmacy Consult for Heparin Indication: atrial fibrillation  No Known Allergies  Patient Measurements: Height: 5\' 11"  (180.3 cm) Weight: 62.1 kg (137 lb) IBW/kg (Calculated) : 75.3 Heparin Dosing Weight: 62 kg  Vital Signs: Temp: 99 F (37.2 C) (09/14 1236) Temp Source: Oral (09/14 1236) BP: 108/69 (09/14 1236) Pulse Rate: 82 (09/14 1200)  Labs: Recent Labs    03/15/23 0649 03/16/23 1246 03/16/23 1834 03/17/23 0518 03/17/23 1248  HGB 15.1 14.7  --  14.2  --   HCT 45.4 44.5  --  42.3  --   PLT 268 248  --  233  --   HEPARINUNFRC  --   --  0.14* 0.18* 0.10*  CREATININE 0.69 0.75  --  0.75  --     Estimated Creatinine Clearance: 83 mL/min (by C-G formula based on SCr of 0.75 mg/dL).   Medical History: History reviewed. No pertinent past medical history.   Assessment: 62 y.o. male with no PMH who is admitted presented after a fall from a ladder with with left 7 through 9 rib fractures and small left pneumothorax, found to be in A-fib with RVR. Pharmacy consulted to manage heparin.  Heparin level remains low (0.10) on infusion at 1250 units/hr. No interruptions with infusion or issues with bleeding reported per RN. CBC stable.  Goal of Therapy:  Heparin level 0.3-0.7 units/ml Monitor platelets by anticoagulation protocol: Yes   Plan:  Increase heparin to 1500 units / hr F/u 8hr heparin level Monitor CBC and s/sx of bleeding F/u plans to transition to DOAC  Nicole Kindred, PharmD PGY1 Pharmacy Resident 03/17/2023 2:44 PM

## 2023-03-17 NOTE — Plan of Care (Signed)
Problem: Health Behavior/Discharge Planning: Goal: Ability to manage health-related needs will improve Outcome: Progressing   Problem: Clinical Measurements: Goal: Ability to maintain clinical measurements within normal limits will improve Outcome: Progressing   Problem: Pain Managment: Goal: General experience of comfort will improve Outcome: Progressing

## 2023-03-17 NOTE — Progress Notes (Signed)
Pt ambulated 225 feet in the hallway with no assistive devices or supplemental oxygen. Pt had a steady gait with no distress noted during ambulation. Once pt was hooked back up to wall pulse ox, O2 read 80%. It took 2 mins for pt to reach an O2 sat of 88% on room air. Pt placed back on 2L [place] and recovered to 92% within 1.5 mins.

## 2023-03-17 NOTE — Progress Notes (Signed)
Progress Note  1 Day Post-Op  Subjective: Called back for increased PTX after patient had episode of shortness of breath.    Objective: Vital signs in last 24 hours: Temp:  [97.4 F (36.3 C)-99.5 F (37.5 C)] 99 F (37.2 C) (09/14 1236) Pulse Rate:  [72-96] 82 (09/14 1200) Resp:  [16-23] 20 (09/14 1200) BP: (100-135)/(65-86) 108/69 (09/14 1236) SpO2:  [85 %-97 %] 92 % (09/14 1200) FiO2 (%):  [21 %] 21 % (09/14 0845)    Intake/Output from previous day: 09/13 0701 - 09/14 0700 In: 792.4 [P.O.:500; I.V.:292.4] Out: -  Intake/Output this shift: No intake/output data recorded.  PE: General: pleasant, WD, WN male who is sitting up in NAD Heart: irregularly irregular Lungs: CTAB, no wheezes, rhonchi, or rales noted.  Respiratory effort nonlabored.  Good breath sounds to top of lungs.  Abd: soft, NT, ND MS: moving all 4 extremities Psych: A&Ox3 with an appropriate affect.    Lab Results:  Recent Labs    03/16/23 1246 03/17/23 0518  WBC 13.4* 13.8*  HGB 14.7 14.2  HCT 44.5 42.3  PLT 248 233   BMET Recent Labs    03/16/23 1246 03/17/23 0518  NA 136 137  K 4.2 3.9  CL 98 99  CO2 27 27  GLUCOSE 87 96  BUN 13 13  CREATININE 0.75 0.75  CALCIUM 9.1 8.8*   PT/INR No results for input(s): "LABPROT", "INR" in the last 72 hours. CMP     Component Value Date/Time   NA 137 03/17/2023 0518   K 3.9 03/17/2023 0518   CL 99 03/17/2023 0518   CO2 27 03/17/2023 0518   GLUCOSE 96 03/17/2023 0518   BUN 13 03/17/2023 0518   CREATININE 0.75 03/17/2023 0518   CREATININE 0.69 (L) 07/29/2016 1201   CALCIUM 8.8 (L) 03/17/2023 0518   PROT 7.9 03/14/2023 1653   ALBUMIN 4.7 03/14/2023 1653   AST 31 03/14/2023 1653   ALT 28 03/14/2023 1653   ALKPHOS 83 03/14/2023 1653   BILITOT 1.0 03/14/2023 1653   GFRNONAA >60 03/17/2023 0518   GFRNONAA >89 07/29/2016 1201   GFRAA >89 07/29/2016 1201   Lipase  No results found for: "LIPASE"     Studies/Results: DG Chest Port 1  View  Result Date: 03/17/2023 CLINICAL DATA:  141880 SOB (shortness of breath) 141880 EXAM: PORTABLE CHEST - 1 VIEW COMPARISON:  03/16/2023 FINDINGS: Patchy interstitial and airspace opacities in both lung bases have increased since previous. Slight apparent increase in the left apical pneumothorax, lung apex projecting at the posterior aspect third rib. Heart size and mediastinal contours are within normal limits. Aortic Atherosclerosis (ICD10-170.0). No effusion. Visualized bones unremarkable. IMPRESSION: 1. Slight increase in left apical pneumothorax. 2. Increasing bibasilar infiltrates. Electronically Signed   By: Corlis Leak M.D.   On: 03/17/2023 10:16   ECHO TEE  Result Date: 03/16/2023    TRANSESOPHOGEAL ECHO REPORT   Patient Name:   Kristopher [name] [facility] Date of Exam: 03/16/2023 Medical Rec #:  [id number]        Height:       71.0 in Accession #:    [id number]       Weight:       137.0 lb Date of Birth:  [birth year]         BSA:          1.795 m Patient Age:    62 years         BP:  Progress Note  1 Day Post-Op  Subjective: Called back for increased PTX after patient had episode of shortness of breath.    Objective: Vital signs in last 24 hours: Temp:  [97.4 F (36.3 C)-99.5 F (37.5 C)] 99 F (37.2 C) (09/14 1236) Pulse Rate:  [72-96] 82 (09/14 1200) Resp:  [16-23] 20 (09/14 1200) BP: (100-135)/(65-86) 108/69 (09/14 1236) SpO2:  [85 %-97 %] 92 % (09/14 1200) FiO2 (%):  [21 %] 21 % (09/14 0845)    Intake/Output from previous day: 09/13 0701 - 09/14 0700 In: 792.4 [P.O.:500; I.V.:292.4] Out: -  Intake/Output this shift: No intake/output data recorded.  PE: General: pleasant, WD, WN male who is sitting up in NAD Heart: irregularly irregular Lungs: CTAB, no wheezes, rhonchi, or rales noted.  Respiratory effort nonlabored.  Good breath sounds to top of lungs.  Abd: soft, NT, ND MS: moving all 4 extremities Psych: A&Ox3 with an appropriate affect.    Lab Results:  Recent Labs    03/16/23 1246 03/17/23 0518  WBC 13.4* 13.8*  HGB 14.7 14.2  HCT 44.5 42.3  PLT 248 233   BMET Recent Labs    03/16/23 1246 03/17/23 0518  NA 136 137  K 4.2 3.9  CL 98 99  CO2 27 27  GLUCOSE 87 96  BUN 13 13  CREATININE 0.75 0.75  CALCIUM 9.1 8.8*   PT/INR No results for input(s): "LABPROT", "INR" in the last 72 hours. CMP     Component Value Date/Time   NA 137 03/17/2023 0518   K 3.9 03/17/2023 0518   CL 99 03/17/2023 0518   CO2 27 03/17/2023 0518   GLUCOSE 96 03/17/2023 0518   BUN 13 03/17/2023 0518   CREATININE 0.75 03/17/2023 0518   CREATININE 0.69 (L) 07/29/2016 1201   CALCIUM 8.8 (L) 03/17/2023 0518   PROT 7.9 03/14/2023 1653   ALBUMIN 4.7 03/14/2023 1653   AST 31 03/14/2023 1653   ALT 28 03/14/2023 1653   ALKPHOS 83 03/14/2023 1653   BILITOT 1.0 03/14/2023 1653   GFRNONAA >60 03/17/2023 0518   GFRNONAA >89 07/29/2016 1201   GFRAA >89 07/29/2016 1201   Lipase  No results found for: "LIPASE"     Studies/Results: DG Chest Port 1  View  Result Date: 03/17/2023 CLINICAL DATA:  141880 SOB (shortness of breath) 141880 EXAM: PORTABLE CHEST - 1 VIEW COMPARISON:  03/16/2023 FINDINGS: Patchy interstitial and airspace opacities in both lung bases have increased since previous. Slight apparent increase in the left apical pneumothorax, lung apex projecting at the posterior aspect third rib. Heart size and mediastinal contours are within normal limits. Aortic Atherosclerosis (ICD10-170.0). No effusion. Visualized bones unremarkable. IMPRESSION: 1. Slight increase in left apical pneumothorax. 2. Increasing bibasilar infiltrates. Electronically Signed   By: Corlis Leak M.D.   On: 03/17/2023 10:16   ECHO TEE  Result Date: 03/16/2023    TRANSESOPHOGEAL ECHO REPORT   Patient Name:   Kristopher [name] [facility] Date of Exam: 03/16/2023 Medical Rec #:  [id number]        Height:       71.0 in Accession #:    [id number]       Weight:       137.0 lb Date of Birth:  [birth year]         BSA:          1.795 m Patient Age:    62 years         BP:  Progress Note  1 Day Post-Op  Subjective: Called back for increased PTX after patient had episode of shortness of breath.    Objective: Vital signs in last 24 hours: Temp:  [97.4 F (36.3 C)-99.5 F (37.5 C)] 99 F (37.2 C) (09/14 1236) Pulse Rate:  [72-96] 82 (09/14 1200) Resp:  [16-23] 20 (09/14 1200) BP: (100-135)/(65-86) 108/69 (09/14 1236) SpO2:  [85 %-97 %] 92 % (09/14 1200) FiO2 (%):  [21 %] 21 % (09/14 0845)    Intake/Output from previous day: 09/13 0701 - 09/14 0700 In: 792.4 [P.O.:500; I.V.:292.4] Out: -  Intake/Output this shift: No intake/output data recorded.  PE: General: pleasant, WD, WN male who is sitting up in NAD Heart: irregularly irregular Lungs: CTAB, no wheezes, rhonchi, or rales noted.  Respiratory effort nonlabored.  Good breath sounds to top of lungs.  Abd: soft, NT, ND MS: moving all 4 extremities Psych: A&Ox3 with an appropriate affect.    Lab Results:  Recent Labs    03/16/23 1246 03/17/23 0518  WBC 13.4* 13.8*  HGB 14.7 14.2  HCT 44.5 42.3  PLT 248 233   BMET Recent Labs    03/16/23 1246 03/17/23 0518  NA 136 137  K 4.2 3.9  CL 98 99  CO2 27 27  GLUCOSE 87 96  BUN 13 13  CREATININE 0.75 0.75  CALCIUM 9.1 8.8*   PT/INR No results for input(s): "LABPROT", "INR" in the last 72 hours. CMP     Component Value Date/Time   NA 137 03/17/2023 0518   K 3.9 03/17/2023 0518   CL 99 03/17/2023 0518   CO2 27 03/17/2023 0518   GLUCOSE 96 03/17/2023 0518   BUN 13 03/17/2023 0518   CREATININE 0.75 03/17/2023 0518   CREATININE 0.69 (L) 07/29/2016 1201   CALCIUM 8.8 (L) 03/17/2023 0518   PROT 7.9 03/14/2023 1653   ALBUMIN 4.7 03/14/2023 1653   AST 31 03/14/2023 1653   ALT 28 03/14/2023 1653   ALKPHOS 83 03/14/2023 1653   BILITOT 1.0 03/14/2023 1653   GFRNONAA >60 03/17/2023 0518   GFRNONAA >89 07/29/2016 1201   GFRAA >89 07/29/2016 1201   Lipase  No results found for: "LIPASE"     Studies/Results: DG Chest Port 1  View  Result Date: 03/17/2023 CLINICAL DATA:  141880 SOB (shortness of breath) 141880 EXAM: PORTABLE CHEST - 1 VIEW COMPARISON:  03/16/2023 FINDINGS: Patchy interstitial and airspace opacities in both lung bases have increased since previous. Slight apparent increase in the left apical pneumothorax, lung apex projecting at the posterior aspect third rib. Heart size and mediastinal contours are within normal limits. Aortic Atherosclerosis (ICD10-170.0). No effusion. Visualized bones unremarkable. IMPRESSION: 1. Slight increase in left apical pneumothorax. 2. Increasing bibasilar infiltrates. Electronically Signed   By: Corlis Leak M.D.   On: 03/17/2023 10:16   ECHO TEE  Result Date: 03/16/2023    TRANSESOPHOGEAL ECHO REPORT   Patient Name:   Kristopher [name] [facility] Date of Exam: 03/16/2023 Medical Rec #:  [id number]        Height:       71.0 in Accession #:    [id number]       Weight:       137.0 lb Date of Birth:  [birth year]         BSA:          1.795 m Patient Age:    62 years         BP:  03/16/2023 See surgical note for result.  DG CHEST PORT 1 VIEW  Result Date: 03/16/2023 CLINICAL DATA:  Fall from ladder with left rib fracture. EXAM: PORTABLE CHEST 1 VIEW COMPARISON:  03/15/2023 FINDINGS: Interval decrease in left apical pneumothorax. Interval increase in retrocardiac opacity, likely atelectasis. Interstitial markings are diffusely coarsened with chronic features. No substantial pleural effusion. Posterolateral inferior left rib fractures again noted. Bones are diffusely demineralized. Telemetry leads overlie the chest. IMPRESSION: 1. Interval decrease in left apical pneumothorax. 2. Interval increase in retrocardiac opacity, likely atelectasis. Electronically Signed   By: Kennith Center M.D.   On: 03/16/2023 07:57   ECHOCARDIOGRAM COMPLETE  Result Date: 03/15/2023    ECHOCARDIOGRAM REPORT   Patient Name:   MILLION GIANNAKOPOULOS [facility] Date of Exam: 03/15/2023 Medical Rec #:  [id number]        Height:       71.0 in Accession #:    [id number]       Weight:       137.0 lb Date of Birth:  [birth year]         BSA:          1.795 m Patient Age:    62 years         BP:           108/73 mmHg  Patient Gender: M                HR:           98 bpm. Exam Location:  Inpatient Procedure: 2D Echo, Cardiac Doppler and Color Doppler Indications:    Atrial Fibrillation I48.91  History:        Patient has no prior history of Echocardiogram examinations.                 Arrythmias:Atrial Fibrillation.  Sonographer:    Lucendia Herrlich Referring Phys: [id number] VASUNDHRA RATHORE IMPRESSIONS  1. Left ventricular ejection fraction, by estimation, is 55 to 60%. The left ventricle has normal function. The left ventricle has no regional wall motion abnormalities. Left ventricular diastolic function could not be evaluated.  2. Right ventricular systolic function is normal. The right ventricular size is normal. There is mildly elevated pulmonary artery systolic pressure.  3. Left atrial size was moderately dilated.  4. Right atrial size was moderately dilated.  5. The mitral valve is normal in structure. Mild to moderate mitral valve regurgitation. No evidence of mitral stenosis.  6. The aortic valve is normal in structure. Aortic valve regurgitation is not visualized. No aortic stenosis is present.  7. The inferior vena cava is dilated in size with <50% respiratory variability, suggesting right atrial pressure of 15 mmHg. FINDINGS  Left Ventricle: Left ventricular ejection fraction, by estimation, is 55 to 60%. The left ventricle has normal function. The left ventricle has no regional wall motion abnormalities. The left ventricular internal cavity size was normal in size. There is  no left ventricular hypertrophy. Left ventricular diastolic function could not be evaluated due to atrial fibrillation. Left ventricular diastolic function could not be evaluated. Right Ventricle: The right ventricular size is normal. No increase in right ventricular wall thickness. Right ventricular systolic function is normal. There is mildly elevated pulmonary artery systolic pressure. The tricuspid regurgitant velocity is 2.71  m/s, and with  an assumed right atrial pressure of 15 mmHg, the estimated right ventricular systolic pressure is 44.4 mmHg. Left Atrium: Left atrial size was moderately dilated. Right Atrium: Right atrial size was moderately dilated. Pericardium:  Progress Note  1 Day Post-Op  Subjective: Called back for increased PTX after patient had episode of shortness of breath.    Objective: Vital signs in last 24 hours: Temp:  [97.4 F (36.3 C)-99.5 F (37.5 C)] 99 F (37.2 C) (09/14 1236) Pulse Rate:  [72-96] 82 (09/14 1200) Resp:  [16-23] 20 (09/14 1200) BP: (100-135)/(65-86) 108/69 (09/14 1236) SpO2:  [85 %-97 %] 92 % (09/14 1200) FiO2 (%):  [21 %] 21 % (09/14 0845)    Intake/Output from previous day: 09/13 0701 - 09/14 0700 In: 792.4 [P.O.:500; I.V.:292.4] Out: -  Intake/Output this shift: No intake/output data recorded.  PE: General: pleasant, WD, WN male who is sitting up in NAD Heart: irregularly irregular Lungs: CTAB, no wheezes, rhonchi, or rales noted.  Respiratory effort nonlabored.  Good breath sounds to top of lungs.  Abd: soft, NT, ND MS: moving all 4 extremities Psych: A&Ox3 with an appropriate affect.    Lab Results:  Recent Labs    03/16/23 1246 03/17/23 0518  WBC 13.4* 13.8*  HGB 14.7 14.2  HCT 44.5 42.3  PLT 248 233   BMET Recent Labs    03/16/23 1246 03/17/23 0518  NA 136 137  K 4.2 3.9  CL 98 99  CO2 27 27  GLUCOSE 87 96  BUN 13 13  CREATININE 0.75 0.75  CALCIUM 9.1 8.8*   PT/INR No results for input(s): "LABPROT", "INR" in the last 72 hours. CMP     Component Value Date/Time   NA 137 03/17/2023 0518   K 3.9 03/17/2023 0518   CL 99 03/17/2023 0518   CO2 27 03/17/2023 0518   GLUCOSE 96 03/17/2023 0518   BUN 13 03/17/2023 0518   CREATININE 0.75 03/17/2023 0518   CREATININE 0.69 (L) 07/29/2016 1201   CALCIUM 8.8 (L) 03/17/2023 0518   PROT 7.9 03/14/2023 1653   ALBUMIN 4.7 03/14/2023 1653   AST 31 03/14/2023 1653   ALT 28 03/14/2023 1653   ALKPHOS 83 03/14/2023 1653   BILITOT 1.0 03/14/2023 1653   GFRNONAA >60 03/17/2023 0518   GFRNONAA >89 07/29/2016 1201   GFRAA >89 07/29/2016 1201   Lipase  No results found for: "LIPASE"     Studies/Results: DG Chest Port 1  View  Result Date: 03/17/2023 CLINICAL DATA:  141880 SOB (shortness of breath) 141880 EXAM: PORTABLE CHEST - 1 VIEW COMPARISON:  03/16/2023 FINDINGS: Patchy interstitial and airspace opacities in both lung bases have increased since previous. Slight apparent increase in the left apical pneumothorax, lung apex projecting at the posterior aspect third rib. Heart size and mediastinal contours are within normal limits. Aortic Atherosclerosis (ICD10-170.0). No effusion. Visualized bones unremarkable. IMPRESSION: 1. Slight increase in left apical pneumothorax. 2. Increasing bibasilar infiltrates. Electronically Signed   By: Corlis Leak M.D.   On: 03/17/2023 10:16   ECHO TEE  Result Date: 03/16/2023    TRANSESOPHOGEAL ECHO REPORT   Patient Name:   Kristopher [name] [facility] Date of Exam: 03/16/2023 Medical Rec #:  [id number]        Height:       71.0 in Accession #:    [id number]       Weight:       137.0 lb Date of Birth:  [birth year]         BSA:          1.795 m Patient Age:    62 years         BP:

## 2023-03-17 NOTE — Progress Notes (Signed)
mitral stenosis.  5. The aortic valve is normal in structure. Aortic valve regurgitation is not  visualized. No aortic stenosis is present.  6. The inferior vena cava is normal in size with greater than 50% respiratory variability, suggesting right atrial pressure of 3 mmHg. Conclusion(s)/Recommendation(s): Normal biventricular function without evidence of hemodynamically significant valvular heart disease. FINDINGS  Left Ventricle: Left ventricular ejection fraction, by estimation, is 60 to 65%. The left ventricle has normal function. The left ventricle has no regional wall motion abnormalities. The left ventricular internal cavity size was normal in size. There is  no left ventricular hypertrophy. Right Ventricle: The right ventricular size is normal. No increase in right ventricular wall thickness. Right ventricular systolic function is normal. Left Atrium: Left atrial size was normal in size. No left atrial/left atrial appendage thrombus was detected. The LAA emptying velocity was 26 cm/s. Right Atrium: Right atrial size was normal in size. Pericardium: There is no evidence of pericardial effusion. Mitral Valve: The mitral valve is normal in structure. Mild mitral valve regurgitation. No evidence of mitral valve stenosis. Tricuspid Valve: The tricuspid valve is normal in structure. Tricuspid valve regurgitation is not demonstrated. No evidence of tricuspid stenosis. Aortic Valve: The aortic valve is normal in structure. Aortic valve regurgitation is not visualized. No aortic stenosis is present. Pulmonic Valve: The pulmonic valve was normal in structure. Pulmonic valve regurgitation is not visualized. No evidence of pulmonic stenosis. Aorta: The aortic root is normal in size and structure. Venous: The inferior vena cava is normal in size with greater than 50% respiratory variability, suggesting right atrial pressure of 3 mmHg. IAS/Shunts: No atrial level shunt detected by color flow Doppler. Additional Comments: Spectral Doppler performed. Yates Decamp MD Electronically signed by Yates Decamp MD Signature  Date/Time: 03/16/2023/9:16:07 PM    Final    EP STUDY  Result Date: 03/16/2023 See surgical note for result.  DG CHEST PORT 1 VIEW  Result Date: 03/16/2023 CLINICAL DATA:  Fall from ladder with left rib fracture. EXAM: PORTABLE CHEST 1 VIEW COMPARISON:  03/15/2023 FINDINGS: Interval decrease in left apical pneumothorax. Interval increase in retrocardiac opacity, likely atelectasis. Interstitial markings are diffusely coarsened with chronic features. No substantial pleural effusion. Posterolateral inferior left rib fractures again noted. Bones are diffusely demineralized. Telemetry leads overlie the chest. IMPRESSION: 1. Interval decrease in left apical pneumothorax. 2. Interval increase in retrocardiac opacity, likely atelectasis. Electronically Signed   By: Kennith Center M.D.   On: 03/16/2023 07:57   ECHOCARDIOGRAM COMPLETE  Result Date: 03/15/2023    ECHOCARDIOGRAM REPORT   Patient Name:   Kristopher [name] [facility] Date of Exam: 03/15/2023 Medical Rec #:  [id number]        Height:       71.0 in Accession #:    [id number]       Weight:       137.0 lb Date of Birth:  03-17-1961         BSA:          1.795 m Patient Age:    62 years         BP:           108/73 mmHg Patient Gender: M                HR:           98 bpm. Exam Location:  Inpatient Procedure: 2D Echo, Cardiac Doppler and Color Doppler Indications:    Atrial Fibrillation I48.91  History:  mitral stenosis.  5. The aortic valve is normal in structure. Aortic valve regurgitation is not  visualized. No aortic stenosis is present.  6. The inferior vena cava is normal in size with greater than 50% respiratory variability, suggesting right atrial pressure of 3 mmHg. Conclusion(s)/Recommendation(s): Normal biventricular function without evidence of hemodynamically significant valvular heart disease. FINDINGS  Left Ventricle: Left ventricular ejection fraction, by estimation, is 60 to 65%. The left ventricle has normal function. The left ventricle has no regional wall motion abnormalities. The left ventricular internal cavity size was normal in size. There is  no left ventricular hypertrophy. Right Ventricle: The right ventricular size is normal. No increase in right ventricular wall thickness. Right ventricular systolic function is normal. Left Atrium: Left atrial size was normal in size. No left atrial/left atrial appendage thrombus was detected. The LAA emptying velocity was 26 cm/s. Right Atrium: Right atrial size was normal in size. Pericardium: There is no evidence of pericardial effusion. Mitral Valve: The mitral valve is normal in structure. Mild mitral valve regurgitation. No evidence of mitral valve stenosis. Tricuspid Valve: The tricuspid valve is normal in structure. Tricuspid valve regurgitation is not demonstrated. No evidence of tricuspid stenosis. Aortic Valve: The aortic valve is normal in structure. Aortic valve regurgitation is not visualized. No aortic stenosis is present. Pulmonic Valve: The pulmonic valve was normal in structure. Pulmonic valve regurgitation is not visualized. No evidence of pulmonic stenosis. Aorta: The aortic root is normal in size and structure. Venous: The inferior vena cava is normal in size with greater than 50% respiratory variability, suggesting right atrial pressure of 3 mmHg. IAS/Shunts: No atrial level shunt detected by color flow Doppler. Additional Comments: Spectral Doppler performed. Yates Decamp MD Electronically signed by Yates Decamp MD Signature  Date/Time: 03/16/2023/9:16:07 PM    Final    EP STUDY  Result Date: 03/16/2023 See surgical note for result.  DG CHEST PORT 1 VIEW  Result Date: 03/16/2023 CLINICAL DATA:  Fall from ladder with left rib fracture. EXAM: PORTABLE CHEST 1 VIEW COMPARISON:  03/15/2023 FINDINGS: Interval decrease in left apical pneumothorax. Interval increase in retrocardiac opacity, likely atelectasis. Interstitial markings are diffusely coarsened with chronic features. No substantial pleural effusion. Posterolateral inferior left rib fractures again noted. Bones are diffusely demineralized. Telemetry leads overlie the chest. IMPRESSION: 1. Interval decrease in left apical pneumothorax. 2. Interval increase in retrocardiac opacity, likely atelectasis. Electronically Signed   By: Kennith Center M.D.   On: 03/16/2023 07:57   ECHOCARDIOGRAM COMPLETE  Result Date: 03/15/2023    ECHOCARDIOGRAM REPORT   Patient Name:   Kristopher [name] [facility] Date of Exam: 03/15/2023 Medical Rec #:  [id number]        Height:       71.0 in Accession #:    [id number]       Weight:       137.0 lb Date of Birth:  03-17-1961         BSA:          1.795 m Patient Age:    62 years         BP:           108/73 mmHg Patient Gender: M                HR:           98 bpm. Exam Location:  Inpatient Procedure: 2D Echo, Cardiac Doppler and Color Doppler Indications:    Atrial Fibrillation I48.91  History:  mitral stenosis.  5. The aortic valve is normal in structure. Aortic valve regurgitation is not  visualized. No aortic stenosis is present.  6. The inferior vena cava is normal in size with greater than 50% respiratory variability, suggesting right atrial pressure of 3 mmHg. Conclusion(s)/Recommendation(s): Normal biventricular function without evidence of hemodynamically significant valvular heart disease. FINDINGS  Left Ventricle: Left ventricular ejection fraction, by estimation, is 60 to 65%. The left ventricle has normal function. The left ventricle has no regional wall motion abnormalities. The left ventricular internal cavity size was normal in size. There is  no left ventricular hypertrophy. Right Ventricle: The right ventricular size is normal. No increase in right ventricular wall thickness. Right ventricular systolic function is normal. Left Atrium: Left atrial size was normal in size. No left atrial/left atrial appendage thrombus was detected. The LAA emptying velocity was 26 cm/s. Right Atrium: Right atrial size was normal in size. Pericardium: There is no evidence of pericardial effusion. Mitral Valve: The mitral valve is normal in structure. Mild mitral valve regurgitation. No evidence of mitral valve stenosis. Tricuspid Valve: The tricuspid valve is normal in structure. Tricuspid valve regurgitation is not demonstrated. No evidence of tricuspid stenosis. Aortic Valve: The aortic valve is normal in structure. Aortic valve regurgitation is not visualized. No aortic stenosis is present. Pulmonic Valve: The pulmonic valve was normal in structure. Pulmonic valve regurgitation is not visualized. No evidence of pulmonic stenosis. Aorta: The aortic root is normal in size and structure. Venous: The inferior vena cava is normal in size with greater than 50% respiratory variability, suggesting right atrial pressure of 3 mmHg. IAS/Shunts: No atrial level shunt detected by color flow Doppler. Additional Comments: Spectral Doppler performed. Yates Decamp MD Electronically signed by Yates Decamp MD Signature  Date/Time: 03/16/2023/9:16:07 PM    Final    EP STUDY  Result Date: 03/16/2023 See surgical note for result.  DG CHEST PORT 1 VIEW  Result Date: 03/16/2023 CLINICAL DATA:  Fall from ladder with left rib fracture. EXAM: PORTABLE CHEST 1 VIEW COMPARISON:  03/15/2023 FINDINGS: Interval decrease in left apical pneumothorax. Interval increase in retrocardiac opacity, likely atelectasis. Interstitial markings are diffusely coarsened with chronic features. No substantial pleural effusion. Posterolateral inferior left rib fractures again noted. Bones are diffusely demineralized. Telemetry leads overlie the chest. IMPRESSION: 1. Interval decrease in left apical pneumothorax. 2. Interval increase in retrocardiac opacity, likely atelectasis. Electronically Signed   By: Kennith Center M.D.   On: 03/16/2023 07:57   ECHOCARDIOGRAM COMPLETE  Result Date: 03/15/2023    ECHOCARDIOGRAM REPORT   Patient Name:   Kristopher [name] [facility] Date of Exam: 03/15/2023 Medical Rec #:  [id number]        Height:       71.0 in Accession #:    [id number]       Weight:       137.0 lb Date of Birth:  03-17-1961         BSA:          1.795 m Patient Age:    62 years         BP:           108/73 mmHg Patient Gender: M                HR:           98 bpm. Exam Location:  Inpatient Procedure: 2D Echo, Cardiac Doppler and Color Doppler Indications:    Atrial Fibrillation I48.91  History:  mitral stenosis.  5. The aortic valve is normal in structure. Aortic valve regurgitation is not  visualized. No aortic stenosis is present.  6. The inferior vena cava is normal in size with greater than 50% respiratory variability, suggesting right atrial pressure of 3 mmHg. Conclusion(s)/Recommendation(s): Normal biventricular function without evidence of hemodynamically significant valvular heart disease. FINDINGS  Left Ventricle: Left ventricular ejection fraction, by estimation, is 60 to 65%. The left ventricle has normal function. The left ventricle has no regional wall motion abnormalities. The left ventricular internal cavity size was normal in size. There is  no left ventricular hypertrophy. Right Ventricle: The right ventricular size is normal. No increase in right ventricular wall thickness. Right ventricular systolic function is normal. Left Atrium: Left atrial size was normal in size. No left atrial/left atrial appendage thrombus was detected. The LAA emptying velocity was 26 cm/s. Right Atrium: Right atrial size was normal in size. Pericardium: There is no evidence of pericardial effusion. Mitral Valve: The mitral valve is normal in structure. Mild mitral valve regurgitation. No evidence of mitral valve stenosis. Tricuspid Valve: The tricuspid valve is normal in structure. Tricuspid valve regurgitation is not demonstrated. No evidence of tricuspid stenosis. Aortic Valve: The aortic valve is normal in structure. Aortic valve regurgitation is not visualized. No aortic stenosis is present. Pulmonic Valve: The pulmonic valve was normal in structure. Pulmonic valve regurgitation is not visualized. No evidence of pulmonic stenosis. Aorta: The aortic root is normal in size and structure. Venous: The inferior vena cava is normal in size with greater than 50% respiratory variability, suggesting right atrial pressure of 3 mmHg. IAS/Shunts: No atrial level shunt detected by color flow Doppler. Additional Comments: Spectral Doppler performed. Yates Decamp MD Electronically signed by Yates Decamp MD Signature  Date/Time: 03/16/2023/9:16:07 PM    Final    EP STUDY  Result Date: 03/16/2023 See surgical note for result.  DG CHEST PORT 1 VIEW  Result Date: 03/16/2023 CLINICAL DATA:  Fall from ladder with left rib fracture. EXAM: PORTABLE CHEST 1 VIEW COMPARISON:  03/15/2023 FINDINGS: Interval decrease in left apical pneumothorax. Interval increase in retrocardiac opacity, likely atelectasis. Interstitial markings are diffusely coarsened with chronic features. No substantial pleural effusion. Posterolateral inferior left rib fractures again noted. Bones are diffusely demineralized. Telemetry leads overlie the chest. IMPRESSION: 1. Interval decrease in left apical pneumothorax. 2. Interval increase in retrocardiac opacity, likely atelectasis. Electronically Signed   By: Kennith Center M.D.   On: 03/16/2023 07:57   ECHOCARDIOGRAM COMPLETE  Result Date: 03/15/2023    ECHOCARDIOGRAM REPORT   Patient Name:   Kristopher [name] [facility] Date of Exam: 03/15/2023 Medical Rec #:  [id number]        Height:       71.0 in Accession #:    [id number]       Weight:       137.0 lb Date of Birth:  03-17-1961         BSA:          1.795 m Patient Age:    62 years         BP:           108/73 mmHg Patient Gender: M                HR:           98 bpm. Exam Location:  Inpatient Procedure: 2D Echo, Cardiac Doppler and Color Doppler Indications:    Atrial Fibrillation I48.91  History:  PROGRESS NOTE        PATIENT DETAILS Name: Kristopher [name] Age: 61 y.o. Sex: male Date of Birth: [birth year] Admit Date: 03/14/2023 Admitting Physician John Giovanni, MD ZOX:WRUEAVW, No Pcp Per  Brief Summary: Patient is a 62 y.o.  male with?  Prior history of PAF-tobacco use-who fell from a ladder-he was found to have 7-9th left rib fracture-A-fib with RVR and subsequently admitted to the hospitalist service  Significant events: 9/11>> admit to [facility] 9/12>> transferred to [facility] for trauma service evaluation  Significant studies: 9/11>> x-ray left hip: Negative 9/11>> CXR: Left 7th-8th rib fracture-small left apical pneumothorax 9/11>> CT chest: Mildly displaced left seventh, eighth and ninth rib fracture, small left pneumothorax. 9/12>> echo: EF 55-60% 9/12>> TSH: 1.4  Significant microbiology data: None  Procedures: 9/13>> TEE/cardioversion  Consults: Trauma service Cards  Subjective: Desaturates to the mid 80s of oxygen-on 2 L of oxygen.  No complaints otherwise.  Pain is well-controlled with just Tylenol.  Objective: Vitals: Blood pressure 113/72, pulse 74, temperature (!) 97.4 F (36.3 C), temperature source Oral, resp. rate 20, height 5\' 11"  (1.803 m), weight 62.1 kg, SpO2 91%.   Exam: Gen Exam:Alert awake-not in any distress HEENT:atraumatic, normocephalic Chest: B/L clear to auscultation anteriorly CVS:S1S2 regular Abdomen:soft non tender, non distended Extremities:no edema Neurology: Non focal Skin: no rash  Pertinent Labs/Radiology:    Latest Ref Rng & Units 03/17/2023    5:18 AM 03/16/2023   12:46 PM 03/15/2023    6:49 AM  CBC  WBC 4.0 - 10.5 K/uL 13.8  13.4  14.1   Hemoglobin 13.0 - 17.0 g/dL 09.8  11.9  14.7   Hematocrit 39.0 - 52.0 % 42.3  44.5  45.4   Platelets 150 - 400 K/uL 233  248  268     Lab Results  Component Value Date   NA 137 03/17/2023   K 3.9 03/17/2023   CL 99 03/17/2023   CO2 27 03/17/2023       Assessment/Plan: Left 7th through 9th rib fractures Small apical left-sided pneumothorax Following a fall from a ladder-6 feet off the ground Requiring 2 L of oxygen to maintain O2 saturations-mostly with ambulation CXR with more infiltrates today-suspect this is probably related to lung contusion rather than PNA but will empirically start on some Augmentin (discussed with trauma service) Pulmonary tolerating as much as possible Trauma service following  Acute hypoxic respiratory failure Mild-requiring around 2 L to maintain saturations-this is mostly with ambulation but periodically even at rest Etiology likely due to pulmonary contusion rather than PNA-but will place on some antibiotics for now. Pulmonary toileting/mobilize Continue to mobilize  PAF with RVR S/p TEE/cardioversion on 9/13  Maintaining sinus rhythm  No longer on Cardizem infusion-switch to long-acting Cardizem  Continue IV heparin for now-with plans to switch to Eliquis on discharge.   Macrocytosis B12/folic acid levels stable  Mildly prolonged QTc interval Resolved as of EKG on 9/13  BMI: Estimated body mass index is 19.11 kg/m as calculated from the following:   Height as of this encounter: 5\' 11"  (1.803 m).   Weight as of this encounter: 62.1 kg.   Code status:   Code Status: Full Code   DVT Prophylaxis: SCDs Start: 03/15/23 0005   Family Communication: Spouse at bedside   Disposition Plan: Status is: Inpatient Remains inpatient appropriate because: Severity of illness  PROGRESS NOTE        PATIENT DETAILS Name: Kristopher [name] Age: 61 y.o. Sex: male Date of Birth: [birth year] Admit Date: 03/14/2023 Admitting Physician John Giovanni, MD ZOX:WRUEAVW, No Pcp Per  Brief Summary: Patient is a 62 y.o.  male with?  Prior history of PAF-tobacco use-who fell from a ladder-he was found to have 7-9th left rib fracture-A-fib with RVR and subsequently admitted to the hospitalist service  Significant events: 9/11>> admit to [facility] 9/12>> transferred to [facility] for trauma service evaluation  Significant studies: 9/11>> x-ray left hip: Negative 9/11>> CXR: Left 7th-8th rib fracture-small left apical pneumothorax 9/11>> CT chest: Mildly displaced left seventh, eighth and ninth rib fracture, small left pneumothorax. 9/12>> echo: EF 55-60% 9/12>> TSH: 1.4  Significant microbiology data: None  Procedures: 9/13>> TEE/cardioversion  Consults: Trauma service Cards  Subjective: Desaturates to the mid 80s of oxygen-on 2 L of oxygen.  No complaints otherwise.  Pain is well-controlled with just Tylenol.  Objective: Vitals: Blood pressure 113/72, pulse 74, temperature (!) 97.4 F (36.3 C), temperature source Oral, resp. rate 20, height 5\' 11"  (1.803 m), weight 62.1 kg, SpO2 91%.   Exam: Gen Exam:Alert awake-not in any distress HEENT:atraumatic, normocephalic Chest: B/L clear to auscultation anteriorly CVS:S1S2 regular Abdomen:soft non tender, non distended Extremities:no edema Neurology: Non focal Skin: no rash  Pertinent Labs/Radiology:    Latest Ref Rng & Units 03/17/2023    5:18 AM 03/16/2023   12:46 PM 03/15/2023    6:49 AM  CBC  WBC 4.0 - 10.5 K/uL 13.8  13.4  14.1   Hemoglobin 13.0 - 17.0 g/dL 09.8  11.9  14.7   Hematocrit 39.0 - 52.0 % 42.3  44.5  45.4   Platelets 150 - 400 K/uL 233  248  268     Lab Results  Component Value Date   NA 137 03/17/2023   K 3.9 03/17/2023   CL 99 03/17/2023   CO2 27 03/17/2023       Assessment/Plan: Left 7th through 9th rib fractures Small apical left-sided pneumothorax Following a fall from a ladder-6 feet off the ground Requiring 2 L of oxygen to maintain O2 saturations-mostly with ambulation CXR with more infiltrates today-suspect this is probably related to lung contusion rather than PNA but will empirically start on some Augmentin (discussed with trauma service) Pulmonary tolerating as much as possible Trauma service following  Acute hypoxic respiratory failure Mild-requiring around 2 L to maintain saturations-this is mostly with ambulation but periodically even at rest Etiology likely due to pulmonary contusion rather than PNA-but will place on some antibiotics for now. Pulmonary toileting/mobilize Continue to mobilize  PAF with RVR S/p TEE/cardioversion on 9/13  Maintaining sinus rhythm  No longer on Cardizem infusion-switch to long-acting Cardizem  Continue IV heparin for now-with plans to switch to Eliquis on discharge.   Macrocytosis B12/folic acid levels stable  Mildly prolonged QTc interval Resolved as of EKG on 9/13  BMI: Estimated body mass index is 19.11 kg/m as calculated from the following:   Height as of this encounter: 5\' 11"  (1.803 m).   Weight as of this encounter: 62.1 kg.   Code status:   Code Status: Full Code   DVT Prophylaxis: SCDs Start: 03/15/23 0005   Family Communication: Spouse at bedside   Disposition Plan: Status is: Inpatient Remains inpatient appropriate because: Severity of illness

## 2023-03-17 NOTE — Progress Notes (Signed)
ANTICOAGULATION CONSULT NOTE- Follow Up  Pharmacy Consult for Heparin Indication: atrial fibrillation  No Known Allergies  Patient Measurements: Height: 5\' 11"  (180.3 cm) Weight: 62.1 kg (137 lb) IBW/kg (Calculated) : 75.3 Heparin Dosing Weight: 62 kg  Vital Signs: Temp: 98.2 F (36.8 C) (09/14 2025) Temp Source: Axillary (09/14 2025) BP: 119/69 (09/14 2025) Pulse Rate: 81 (09/14 2025)  Labs: Recent Labs    03/15/23 0649 03/16/23 1246 03/16/23 1834 03/17/23 0518 03/17/23 1248 03/17/23 2310  HGB 15.1 14.7  --  14.2  --   --   HCT 45.4 44.5  --  42.3  --   --   PLT 268 248  --  233  --   --   HEPARINUNFRC  --   --    < > 0.18* 0.10* 0.30  CREATININE 0.69 0.75  --  0.75  --   --    < > = values in this interval not displayed.    Estimated Creatinine Clearance: 83 mL/min (by C-G formula based on SCr of 0.75 mg/dL).   Medical History: History reviewed. No pertinent past medical history.   Assessment: 62 y.o. male with no PMH who is admitted presented after a fall from a ladder with with left 7 through 9 rib fractures and small left pneumothorax, found to be in A-fib with RVR. Pharmacy consulted to manage heparin.  Heparin level remains low (0.10) on infusion at 1250 units/hr. No interruptions with infusion or issues with bleeding reported per RN. CBC stable.  9/14 PM: heparin level returned at 0.3 on 1500 units/hr (therapeutic). No signs/symptoms of bleeding or issues with the infusion running documented. Last CBC stable.  Goal of Therapy:  Heparin level 0.3-0.7 units/ml Monitor platelets by anticoagulation protocol: Yes   Plan:  Increase heparin to 1550 units / hr F/u 8hr confirmatory heparin level Monitor CBC and s/sx of bleeding F/u plans to transition to DOAC  Arabella Merles, PharmD. Clinical Pharmacist 03/17/2023 11:57 PM

## 2023-03-17 NOTE — Plan of Care (Signed)

## 2023-03-17 NOTE — Progress Notes (Signed)
ANTICOAGULATION CONSULT NOTE  Pharmacy Consult for Heparin Indication: atrial fibrillation  No Known Allergies  Patient Measurements: Height: 5\' 11"  (180.3 cm) Weight: 62.1 kg (137 lb) IBW/kg (Calculated) : 75.3 Heparin Dosing Weight: 62 kg  Vital Signs: Temp: 97.4 F (36.3 C) (09/14 0336) Temp Source: Oral (09/14 0336) BP: 114/76 (09/14 0509) Pulse Rate: 72 (09/14 0336)  Labs: Recent Labs    03/15/23 0649 03/16/23 1246 03/16/23 1834 03/17/23 0518  HGB 15.1 14.7  --  14.2  HCT 45.4 44.5  --  42.3  PLT 268 248  --  233  HEPARINUNFRC  --   --  0.14* 0.18*  CREATININE 0.69 0.75  --  0.75    Estimated Creatinine Clearance: 83 mL/min (by C-G formula based on SCr of 0.75 mg/dL).   Medical History: History reviewed. No pertinent past medical history.   Assessment: 62 y.o. male with no PMH who is admitted presented after a fall from a ladder with with left 7 through 9 rib fractures and small left pneumothorax, found to be in A-fib with RVR. Pharmacy consulted to manage heparin. CBC stable, platelets 268.  Heparin level remains low (0.18) on infusion at 1100 units/hr. No issues with bleeding reported per RN. RN states that heparin infusion was off for about 15-20 min due to bag ran dry, she restarted 0511 so off right before heparin level drawn - level may be falsely low.  Goal of Therapy:  Heparin level 0.3-0.7 units/ml Monitor platelets by anticoagulation protocol: Yes   Plan:  Increase heparin to 1250 units / hr F/u 6 hr heparin level  Christoper Fabian, PharmD, BCPS Please see amion for complete clinical pharmacist phone list 03/17/2023 6:19 AM

## 2023-03-17 NOTE — Progress Notes (Signed)
Found pt on room air, per pt MD took pt off oxygen earlier.  Currently sat 85-86% on room air, placed pt back on 2 lpm [place], sat now 91%

## 2023-03-18 ENCOUNTER — Inpatient Hospital Stay (HOSPITAL_COMMUNITY): Payer: 59

## 2023-03-18 DIAGNOSIS — D7589 Other specified diseases of blood and blood-forming organs: Secondary | ICD-10-CM | POA: Diagnosis not present

## 2023-03-18 DIAGNOSIS — J939 Pneumothorax, unspecified: Secondary | ICD-10-CM | POA: Diagnosis not present

## 2023-03-18 DIAGNOSIS — I340 Nonrheumatic mitral (valve) insufficiency: Secondary | ICD-10-CM

## 2023-03-18 DIAGNOSIS — Z9181 History of falling: Secondary | ICD-10-CM

## 2023-03-18 DIAGNOSIS — I700 Atherosclerosis of aorta: Secondary | ICD-10-CM

## 2023-03-18 DIAGNOSIS — S2242XK Multiple fractures of ribs, left side, subsequent encounter for fracture with nonunion: Secondary | ICD-10-CM | POA: Diagnosis not present

## 2023-03-18 DIAGNOSIS — I4891 Unspecified atrial fibrillation: Secondary | ICD-10-CM | POA: Diagnosis not present

## 2023-03-18 DIAGNOSIS — F17210 Nicotine dependence, cigarettes, uncomplicated: Secondary | ICD-10-CM

## 2023-03-18 LAB — CBC WITH DIFFERENTIAL/PLATELET
Abs Immature Granulocytes: 0.11 10*3/uL — ABNORMAL HIGH (ref 0.00–0.07)
Basophils Absolute: 0.1 10*3/uL (ref 0.0–0.1)
Basophils Relative: 1 %
Eosinophils Absolute: 0.3 10*3/uL (ref 0.0–0.5)
Eosinophils Relative: 2 %
HCT: 42.7 % (ref 39.0–52.0)
Hemoglobin: 14.3 g/dL (ref 13.0–17.0)
Immature Granulocytes: 1 %
Lymphocytes Relative: 3 %
Lymphs Abs: 0.5 10*3/uL — ABNORMAL LOW (ref 0.7–4.0)
MCH: 34.1 pg — ABNORMAL HIGH (ref 26.0–34.0)
MCHC: 33.5 g/dL (ref 30.0–36.0)
MCV: 101.9 fL — ABNORMAL HIGH (ref 80.0–100.0)
Monocytes Absolute: 1.8 10*3/uL — ABNORMAL HIGH (ref 0.1–1.0)
Monocytes Relative: 12 %
Neutro Abs: 12.4 10*3/uL — ABNORMAL HIGH (ref 1.7–7.7)
Neutrophils Relative %: 81 %
Platelets: 260 10*3/uL (ref 150–400)
RBC: 4.19 MIL/uL — ABNORMAL LOW (ref 4.22–5.81)
RDW: 12 % (ref 11.5–15.5)
WBC: 15.1 10*3/uL — ABNORMAL HIGH (ref 4.0–10.5)
nRBC: 0 % (ref 0.0–0.2)

## 2023-03-18 LAB — HEPARIN LEVEL (UNFRACTIONATED): Heparin Unfractionated: 0.38 [IU]/mL (ref 0.30–0.70)

## 2023-03-18 LAB — MAGNESIUM: Magnesium: 1.9 mg/dL (ref 1.7–2.4)

## 2023-03-18 LAB — BRAIN NATRIURETIC PEPTIDE: B Natriuretic Peptide: 240.9 pg/mL — ABNORMAL HIGH (ref 0.0–100.0)

## 2023-03-18 MED ORDER — AMIODARONE HCL IN DEXTROSE 360-4.14 MG/200ML-% IV SOLN
30.0000 mg/h | INTRAVENOUS | Status: DC
  Administered 2023-03-18 – 2023-03-19 (×2): 30 mg/h via INTRAVENOUS
  Filled 2023-03-18 (×2): qty 200

## 2023-03-18 MED ORDER — FUROSEMIDE 10 MG/ML IJ SOLN
20.0000 mg | Freq: Every day | INTRAMUSCULAR | Status: DC
  Administered 2023-03-18 – 2023-03-19 (×2): 20 mg via INTRAVENOUS
  Filled 2023-03-18 (×2): qty 2

## 2023-03-18 MED ORDER — DILTIAZEM HCL 60 MG PO TABS
60.0000 mg | ORAL_TABLET | Freq: Three times a day (TID) | ORAL | Status: DC
  Administered 2023-03-18 – 2023-03-19 (×3): 60 mg via ORAL
  Filled 2023-03-18 (×3): qty 1

## 2023-03-18 MED ORDER — AMIODARONE LOAD VIA INFUSION
150.0000 mg | Freq: Once | INTRAVENOUS | Status: AC
  Administered 2023-03-18: 150 mg via INTRAVENOUS
  Filled 2023-03-18: qty 83.34

## 2023-03-18 MED ORDER — METOPROLOL TARTRATE 5 MG/5ML IV SOLN
5.0000 mg | INTRAVENOUS | Status: DC | PRN
  Administered 2023-03-18: 5 mg via INTRAVENOUS
  Filled 2023-03-18: qty 5

## 2023-03-18 MED ORDER — AMIODARONE HCL IN DEXTROSE 360-4.14 MG/200ML-% IV SOLN
60.0000 mg/h | INTRAVENOUS | Status: AC
  Administered 2023-03-18: 60 mg/h via INTRAVENOUS
  Filled 2023-03-18 (×2): qty 200

## 2023-03-18 NOTE — Progress Notes (Signed)
PROGRESS NOTE        PATIENT DETAILS Name: Kristopher [name] Age: 62 y.o. Sex: male Date of Birth: Nov 22, 1960 Admit Date: 03/14/2023 Admitting Physician John Giovanni, MD ZOX:[id number], No Pcp Per  Brief Summary: Patient is a 62 y.o.  male with?  Prior history of PAF-tobacco use-who fell from a ladder-he was found to have 7-9th left rib fracture-A-fib with RVR and subsequently admitted to the hospitalist service  Significant events: 9/11>> admit to [name] 9/12>> transferred to [name] for trauma service evaluation 9/13>> cardioversion with restoration of sinus rhythm 9/15>> back in A-fib RVR  Significant studies: 9/11>> x-ray left hip: Negative 9/11>> CXR: Left 7th-8th rib fracture-small left apical pneumothorax 9/11>> CT chest: Mildly displaced left seventh, eighth and ninth rib fracture, small left pneumothorax. 9/12>> echo: EF 55-60% 9/12>> TSH: 1.4  Significant microbiology data: None  Procedures: 9/13>> TEE/cardioversion  Consults: Trauma service Cards  Subjective: No complaints.  Hardly any pain.  On room air this morning.  Objective: Vitals: Blood pressure 132/73, pulse 75, temperature 97.9 F (36.6 C), temperature source Axillary, resp. rate 15, height 5\' 11"  (1.803 m), weight 62.1 kg, SpO2 (!) 83%.   Exam: Gen Exam:Alert awake-not in any distress HEENT:atraumatic, normocephalic Chest: B/L clear to auscultation anteriorly CVS:S1S2 irregular-tachycardic Abdomen:soft non tender, non distended Extremities:no edema Neurology: Non focal Skin: no rash  Pertinent Labs/Radiology:    Latest Ref Rng & Units 03/18/2023    8:30 AM 03/17/2023    5:18 AM 03/16/2023   12:46 PM  CBC  WBC 4.0 - 10.5 K/uL 15.1  13.8  13.4   Hemoglobin 13.0 - 17.0 g/dL 09.8  11.9  14.7   Hematocrit 39.0 - 52.0 % 42.7  42.3  44.5   Platelets 150 - 400 K/uL 260  233  248     Lab Results  Component Value Date   NA 137 03/17/2023   K 3.9 03/17/2023   CL 99  03/17/2023   CO2 27 03/17/2023      Assessment/Plan: Left 7th through 9th rib fractures Small apical left-sided pneumothorax Lung contusions Following a fall from a ladder-6 feet off the ground Stable CXR today Continue pulmonary tolerating Empiric Augmentin x 5 days-although suspect these are lung contusions rather than pneumonia Trauma service following  Acute hypoxic respiratory failure Improved today-on room air Pulmonary tolerating Augmentin.  PAF with RVR S/p TEE/cardioversion on 9/13 with restoration of sinus rhythm-unfortunately-back in A-fib RVR today Changed back to short acting Cardizem IV Lopressor x 1 for rate control Discussed with cardiology-will be started on amiodarone infusion. Continue IV heparin for now.  Macrocytosis B12/folic acid levels stable  Mildly prolonged QTc interval Resolved as of EKG on 9/13  BMI: Estimated body mass index is 19.11 kg/m as calculated from the following:   Height as of this encounter: 5\' 11"  (1.803 m).   Weight as of this encounter: 62.1 kg.   Code status:   Code Status: Full Code   DVT Prophylaxis: SCDs Start: 03/15/23 0005   Family Communication: Spouse at bedside   Disposition Plan: Status is: Inpatient Remains inpatient appropriate because: Severity of illness   Planned Discharge Destination:Home   Diet: Diet Order             Diet Heart Room service appropriate? Yes; Fluid consistency: Thin  Diet effective now  Antimicrobial agents: Anti-infectives (From admission, onward)    Start     Dose/Rate Route Frequency Ordered Stop   03/17/23 1145  amoxicillin-clavulanate (AUGMENTIN) 875-125 MG per tablet 1 tablet        1 tablet Oral Every 12 hours 03/17/23 1048          MEDICATIONS: Scheduled Meds:  acetaminophen  1,000 mg Oral Q6H   amoxicillin-clavulanate  1 tablet Oral Q12H   diltiazem  60 mg Oral Q8H   gabapentin  300 mg Oral TID   guaiFENesin  600 mg Oral BID    ibuprofen  800 mg Oral TID   ipratropium-albuterol  3 mL Nebulization QID   lidocaine  1 patch Transdermal Q24H   methocarbamol  1,000 mg Oral TID   nicotine  14 mg Transdermal Daily   psyllium  1 packet Oral BID   rosuvastatin  10 mg Oral Daily   Continuous Infusions:  heparin 1,550 Units/hr (03/18/23 0015)   methocarbamol (ROBAXIN) IV     ondansetron (ZOFRAN) IV     PRN Meds:.albuterol, alum & mag hydroxide-simeth, bisacodyl, guaiFENesin-dextromethorphan, HYDROmorphone (DILAUDID) injection, magic mouthwash, menthol-cetylpyridinium, methocarbamol (ROBAXIN) IV, metoprolol tartrate, naLOXone (NARCAN)  injection, naphazoline-glycerin, ondansetron (ZOFRAN) IV **OR** ondansetron (ZOFRAN) IV, oxyCODONE, phenol, simethicone, sodium chloride   I have personally reviewed following labs and imaging studies  LABORATORY DATA: CBC: Recent Labs  Lab 03/14/23 1439 03/14/23 1625 03/15/23 0649 03/16/23 1246 03/17/23 0518 03/18/23 0830  WBC 14.1*  --  14.1* 13.4* 13.8* 15.1*  NEUTROABS  --   --   --  10.5* 10.9* 12.4*  HGB 18.0* 17.3* 15.1 14.7 14.2 14.3  HCT 52.6* 51.0 45.4 44.5 42.3 42.7  MCV 101.5*  --  106.3* 102.8* 102.9* 101.9*  PLT 312  --  268 248 233 260    Basic Metabolic Panel: Recent Labs  Lab 03/14/23 1625 03/14/23 1653 03/15/23 0044 03/15/23 0649 03/16/23 1246 03/17/23 0518  NA 138 136  --  133* 136 137  K 4.6 5.0  --  4.6 4.2 3.9  CL 103 100  --  102 98 99  CO2  --  25  --  25 27 27   GLUCOSE 103* 109*  --  129* 87 96  BUN 15 15  --  15 13 13   CREATININE 0.80 0.97  --  0.69 0.75 0.75  CALCIUM  --  9.7  --  9.1 9.1 8.8*  MG  --   --  2.1  --  1.8 1.8    GFR: Estimated Creatinine Clearance: 83 mL/min (by C-G formula based on SCr of 0.75 mg/dL).  Liver Function Tests: Recent Labs  Lab 03/14/23 1653  AST 31  ALT 28  ALKPHOS 83  BILITOT 1.0  PROT 7.9  ALBUMIN 4.7   No results for input(s): "LIPASE", "AMYLASE" in the last 168 hours. No results for  input(s): "AMMONIA" in the last 168 hours.  Coagulation Profile: No results for input(s): "INR", "PROTIME" in the last 168 hours.  Cardiac Enzymes: No results for input(s): "CKTOTAL", "CKMB", "CKMBINDEX", "TROPONINI" in the last 168 hours.  BNP (last 3 results) No results for input(s): "PROBNP" in the last 8760 hours.  Lipid Profile: Recent Labs    03/16/23 1246  CHOL 164  HDL 90  LDLCALC 68  TRIG 32  CHOLHDL 1.8    Thyroid Function Tests: Recent Labs    03/15/23 1332  TSH 1.470    Anemia Panel: Recent Labs    03/16/23 1246  VITAMINB12 376  FOLATE 9.6    Urine analysis: No results found for: "COLORURINE", "APPEARANCEUR", "LABSPEC", "PHURINE", "GLUCOSEU", "HGBUR", "BILIRUBINUR", "KETONESUR", "PROTEINUR", "UROBILINOGEN", "NITRITE", "LEUKOCYTESUR"  Sepsis Labs: Lactic Acid, Venous No results found for: "LATICACIDVEN"  MICROBIOLOGY: No results found for this or any previous visit (from the past 240 hour(s)).  RADIOLOGY STUDIES/RESULTS: DG CHEST PORT 1 VIEW  Result Date: 03/18/2023 CLINICAL DATA:  Evaluate pneumothorax EXAM: PORTABLE CHEST 1 VIEW COMPARISON:  03/17/2023 FINDINGS: Small pneumothorax overlying the left upper lobe is again noted. This measures approximately 1.7 cm over the left apex compared with 2.9 cm previously. Heart size appears normal. Aortic atherosclerosis. Bilateral peripheral increased septal markings are identified compatible with pulmonary edema. Improved appearance of left lower lung atelectasis. IMPRESSION: 1. Slight decrease in size of small left apical pneumothorax. 2. Improved appearance of left lower lung atelectasis. 3. Persistent pulmonary edema. Electronically Signed   By: Signa Kell M.D.   On: 03/18/2023 09:08   DG Chest Port 1 View  Result Date: 03/17/2023 CLINICAL DATA:  141880 SOB (shortness of breath) 141880 EXAM: PORTABLE CHEST - 1 VIEW COMPARISON:  03/16/2023 FINDINGS: Patchy interstitial and airspace opacities in both  lung bases have increased since previous. Slight apparent increase in the left apical pneumothorax, lung apex projecting at the posterior aspect third rib. Heart size and mediastinal contours are within normal limits. Aortic Atherosclerosis (ICD10-170.0). No effusion. Visualized bones unremarkable. IMPRESSION: 1. Slight increase in left apical pneumothorax. 2. Increasing bibasilar infiltrates. Electronically Signed   By: Corlis Leak M.D.   On: 03/17/2023 10:16   ECHO TEE  Result Date: 03/16/2023    TRANSESOPHOGEAL ECHO REPORT   Patient Name:   Kristopher [name] Date of Exam: 03/16/2023 Medical Rec #:  [id number]        Height:       71.0 in Accession #:    [id number]       Weight:       137.0 lb Date of Birth:  10-21-1960         BSA:          1.795 m Patient Age:    62 years         BP:           150/103 mmHg Patient Gender: M                HR:           124 bpm. Exam Location:  Inpatient Procedure: Transesophageal Echo, Color Doppler and Cardiac Doppler Indications:     I48.91* Unspecified atrial fibrillation  History:         Patient has prior history of Echocardiogram examinations, most                  recent 03/15/2023. Arrythmias:Atrial Fibrillation.  Sonographer:     Irving Burton Senior RDCS Referring Phys:  [id number] Yates Decamp Diagnosing Phys: Yates Decamp MD PROCEDURE: After discussion of the risks and benefits of a TEE, an informed consent was obtained from the patient. The transesophogeal probe was passed without difficulty through the esophogus of the patient. Sedation performed by different physician. The patient was monitored while under deep sedation. Anesthestetic sedation was provided intravenously by Anesthesiology: 150mg  of Propofol. The patient's vital signs; including heart rate, blood pressure, and oxygen saturation; remained stable throughout the procedure. The patient developed no complications during the procedure. A successful direct current cardioversion was performed at 150 joules with 1 attempt.   IMPRESSIONS  1. Left ventricular ejection fraction,  by estimation, is 60 to 65%. The left ventricle has normal function. The left ventricle has no regional wall motion abnormalities.  2. Right ventricular systolic function is normal. The right ventricular size is normal.  3. No left atrial/left atrial appendage thrombus was detected. The LAA emptying velocity was 26 cm/s.  4. The mitral valve is normal in structure. Mild mitral valve regurgitation. No evidence of mitral stenosis.  5. The aortic valve is normal in structure. Aortic valve regurgitation is not visualized. No aortic stenosis is present.  6. The inferior vena cava is normal in size with greater than 50% respiratory variability, suggesting right atrial pressure of 3 mmHg. Conclusion(s)/Recommendation(s): Normal biventricular function without evidence of hemodynamically significant valvular heart disease. FINDINGS  Left Ventricle: Left ventricular ejection fraction, by estimation, is 60 to 65%. The left ventricle has normal function. The left ventricle has no regional wall motion abnormalities. The left ventricular internal cavity size was normal in size. There is  no left ventricular hypertrophy. Right Ventricle: The right ventricular size is normal. No increase in right ventricular wall thickness. Right ventricular systolic function is normal. Left Atrium: Left atrial size was normal in size. No left atrial/left atrial appendage thrombus was detected. The LAA emptying velocity was 26 cm/s. Right Atrium: Right atrial size was normal in size. Pericardium: There is no evidence of pericardial effusion. Mitral Valve: The mitral valve is normal in structure. Mild mitral valve regurgitation. No evidence of mitral valve stenosis. Tricuspid Valve: The tricuspid valve is normal in structure. Tricuspid valve regurgitation is not demonstrated. No evidence of tricuspid stenosis. Aortic Valve: The aortic valve is normal in structure. Aortic valve regurgitation is not  visualized. No aortic stenosis is present. Pulmonic Valve: The pulmonic valve was normal in structure. Pulmonic valve regurgitation is not visualized. No evidence of pulmonic stenosis. Aorta: The aortic root is normal in size and structure. Venous: The inferior vena cava is normal in size with greater than 50% respiratory variability, suggesting right atrial pressure of 3 mmHg. IAS/Shunts: No atrial level shunt detected by color flow Doppler. Additional Comments: Spectral Doppler performed. Yates Decamp MD Electronically signed by Yates Decamp MD Signature Date/Time: 03/16/2023/9:16:07 PM    Final    EP STUDY  Result Date: 03/16/2023 See surgical note for result.    LOS: 4 days   Jeoffrey Massed, MD  [facility]    To contact the attending provider between 7A-7P or the covering provider during after hours 7P-7A, please log into the web site [email or web address] and access using universal [place] password for that web site. If you do not have the password, please call the [name] operator.  03/18/2023, 10:40 AM

## 2023-03-18 NOTE — H&P (View-Only) (Signed)
Progress Note  Patient Name: Kristopher [name] MRN: [id number] DOB: Apr 26, 1961 Date of Encounter: 03/18/2023  Attending physician: Maretta Bees, MD Primary care provider: Patient, No Pcp Per  Subjective: Kristopher [name] is a 62 y.o. Caucasian male who was seen and examined at bedside  Cardiology asked to reevaluate the patient has reverted back to A-fib with RVR Denies chest pain or heart failure symptoms.   Objective: Vital Signs in the last 24 hours: Temp:  [97.9 F (36.6 C)-98.6 F (37 C)] 98 F (36.7 C) (09/15 1200) Pulse Rate:  [75-81] 75 (09/15 0400) Resp:  [15-18] 15 (09/15 0400) BP: (110-132)/(64-94) 125/94 (09/15 1043) SpO2:  [83 %-95 %] 95 % (09/15 0833) FiO2 (%):  [21 %] 21 % (09/15 0833)  Intake/Output: No intake or output data in the 24 hours ending 03/18/23 1534  Net IO Since Admission: 1,601.32 mL [03/18/23 1534]  Weights:     03/14/2023    1:37 PM 07/29/2016   10:08 AM  Last 3 Weights  Weight (lbs) 137 lb 139 lb 6.4 oz  Weight (kg) 62.143 kg 63.231 kg      Telemetry:  Overnight telemetry shows atrial fibrillation with controlled ventricular rate (earlier this morning A-fib with RVR), which I personally reviewed.   Physical examination: PHYSICAL EXAM: Vitals:   03/18/23 0833 03/18/23 0934 03/18/23 1043 03/18/23 1200  BP:  132/73 (!) 125/94   Pulse:      Resp:      Temp:    98 F (36.7 C)  TempSrc:    Oral  SpO2: 95%     Weight:      Height:        Physical Exam  Constitutional: No distress.  Age appropriate, hemodynamically stable.   Neck: No JVD present.  Cardiovascular: S1 normal, S2 normal, intact distal pulses and normal pulses. An irregularly irregular rhythm present. Tachycardia present.  No murmurs rubs or gallops appreciated secondary to tachycardia  Pulmonary/Chest: Effort normal. No stridor. He has no wheezes. He has bibasilar rales. Tenderness is present which mimics chest pain.  Abdominal: Soft. Bowel sounds are normal. He  exhibits no distension. There is no abdominal tenderness.  Musculoskeletal:        General: No edema.     Cervical back: Neck supple.  Neurological: He is alert and oriented to person, place, and time. He has intact cranial nerves (2-12).  Skin: Skin is warm and moist.    Lab Results: Chemistry Recent Labs  Lab 03/14/23 1653 03/15/23 0649 03/16/23 1246 03/17/23 0518  NA 136 133* 136 137  K 5.0 4.6 4.2 3.9  CL 100 102 98 99  CO2 25 25 27 27   GLUCOSE 109* 129* 87 96  BUN 15 15 13 13   CREATININE 0.97 0.69 0.75 0.75  CALCIUM 9.7 9.1 9.1 8.8*  PROT 7.9  --   --   --   ALBUMIN 4.7  --   --   --   AST 31  --   --   --   ALT 28  --   --   --   ALKPHOS 83  --   --   --   BILITOT 1.0  --   --   --   GFRNONAA >60 >60 >60 >60  ANIONGAP 11 6 11 11     Hematology Recent Labs  Lab 03/16/23 1246 03/17/23 0518 03/18/23 0830  WBC 13.4* 13.8* 15.1*  RBC 4.33 4.11* 4.19*  HGB 14.7 14.2 14.3  HCT 44.5 42.3  42.7  MCV 102.8* 102.9* 101.9*  MCH 33.9 34.5* 34.1*  MCHC 33.0 33.6 33.5  RDW 12.0 12.1 12.0  PLT 248 233 260   High Sensitivity Troponin:  No results for input(s): "TROPONINIHS" in the last 720 hours.   Cardiac EnzymesNo results for input(s): "TROPONINI" in the last 168 hours. No results for input(s): "TROPIPOC" in the last 168 hours.  BNPNo results for input(s): "BNP", "PROBNP" in the last 168 hours.  DDimer No results for input(s): "DDIMER" in the last 168 hours.  Hemoglobin A1c: No results found for: "HGBA1C", "MPG" TSH  Recent Labs    03/15/23 1332  TSH 1.470   Lipid Panel  Lab Results  Component Value Date   CHOL 164 03/16/2023   HDL 90 03/16/2023   LDLCALC 68 03/16/2023   TRIG 32 03/16/2023   CHOLHDL 1.8 03/16/2023   Drugs of Abuse  No results found for: "LABOPIA", "COCAINSCRNUR", "LABBENZ", "AMPHETMU", "THCU", "LABBARB"    Imaging: DG CHEST PORT 1 VIEW  Result Date: 03/18/2023 CLINICAL DATA:  Evaluate pneumothorax EXAM: PORTABLE CHEST 1 VIEW  COMPARISON:  03/17/2023 FINDINGS: Small pneumothorax overlying the left upper lobe is again noted. This measures approximately 1.7 cm over the left apex compared with 2.9 cm previously. Heart size appears normal. Aortic atherosclerosis. Bilateral peripheral increased septal markings are identified compatible with pulmonary edema. Improved appearance of left lower lung atelectasis. IMPRESSION: 1. Slight decrease in size of small left apical pneumothorax. 2. Improved appearance of left lower lung atelectasis. 3. Persistent pulmonary edema. Electronically Signed   By: Signa Kell M.D.   On: 03/18/2023 09:08   DG Chest Port 1 View  Result Date: 03/17/2023 CLINICAL DATA:  141880 SOB (shortness of breath) [id number] EXAM: PORTABLE CHEST - 1 VIEW COMPARISON:  03/16/2023 FINDINGS: Patchy interstitial and airspace opacities in both lung bases have increased since previous. Slight apparent increase in the left apical pneumothorax, lung apex projecting at the posterior aspect third rib. Heart size and mediastinal contours are within normal limits. Aortic Atherosclerosis (ICD10-170.0). No effusion. Visualized bones unremarkable. IMPRESSION: 1. Slight increase in left apical pneumothorax. 2. Increasing bibasilar infiltrates. Electronically Signed   By: Corlis Leak M.D.   On: 03/17/2023 10:16   ECHO TEE  Result Date: 03/16/2023    TRANSESOPHOGEAL ECHO REPORT   Patient Name:   Kristopher [name] [facility] Date of Exam: 03/16/2023 Medical Rec #:  [id number]        Height:       71.0 in Accession #:    [id number]       Weight:       137.0 lb Date of Birth:  [birth year]         BSA:          1.795 m Patient Age:    62 years         BP:           150/103 mmHg Patient Gender: M                HR:           124 bpm. Exam Location:  Inpatient Procedure: Transesophageal Echo, Color Doppler and Cardiac Doppler Indications:     I48.91* Unspecified atrial fibrillation  History:         Patient has prior history of Echocardiogram examinations, most                   recent 03/15/2023. Arrythmias:Atrial Fibrillation.  Sonographer:     Irving Burton Senior  RDCS Referring Phys:  [id number] Yates Decamp Diagnosing Phys: Yates Decamp MD PROCEDURE: After discussion of the risks and benefits of a TEE, an informed consent was obtained from the patient. The transesophogeal probe was passed without difficulty through the esophogus of the patient. Sedation performed by different physician. The patient was monitored while under deep sedation. Anesthestetic sedation was provided intravenously by Anesthesiology: 150mg  of Propofol. The patient's vital signs; including heart rate, blood pressure, and oxygen saturation; remained stable throughout the procedure. The patient developed no complications during the procedure. A successful direct current cardioversion was performed at 150 joules with 1 attempt.  IMPRESSIONS  1. Left ventricular ejection fraction, by estimation, is 60 to 65%. The left ventricle has normal function. The left ventricle has no regional wall motion abnormalities.  2. Right ventricular systolic function is normal. The right ventricular size is normal.  3. No left atrial/left atrial appendage thrombus was detected. The LAA emptying velocity was 26 cm/s.  4. The mitral valve is normal in structure. Mild mitral valve regurgitation. No evidence of mitral stenosis.  5. The aortic valve is normal in structure. Aortic valve regurgitation is not visualized. No aortic stenosis is present.  6. The inferior vena cava is normal in size with greater than 50% respiratory variability, suggesting right atrial pressure of 3 mmHg. Conclusion(s)/Recommendation(s): Normal biventricular function without evidence of hemodynamically significant valvular heart disease. FINDINGS  Left Ventricle: Left ventricular ejection fraction, by estimation, is 60 to 65%. The left ventricle has normal function. The left ventricle has no regional wall motion abnormalities. The left ventricular internal cavity size was  normal in size. There is  no left ventricular hypertrophy. Right Ventricle: The right ventricular size is normal. No increase in right ventricular wall thickness. Right ventricular systolic function is normal. Left Atrium: Left atrial size was normal in size. No left atrial/left atrial appendage thrombus was detected. The LAA emptying velocity was 26 cm/s. Right Atrium: Right atrial size was normal in size. Pericardium: There is no evidence of pericardial effusion. Mitral Valve: The mitral valve is normal in structure. Mild mitral valve regurgitation. No evidence of mitral valve stenosis. Tricuspid Valve: The tricuspid valve is normal in structure. Tricuspid valve regurgitation is not demonstrated. No evidence of tricuspid stenosis. Aortic Valve: The aortic valve is normal in structure. Aortic valve regurgitation is not visualized. No aortic stenosis is present. Pulmonic Valve: The pulmonic valve was normal in structure. Pulmonic valve regurgitation is not visualized. No evidence of pulmonic stenosis. Aorta: The aortic root is normal in size and structure. Venous: The inferior vena cava is normal in size with greater than 50% respiratory variability, suggesting right atrial pressure of 3 mmHg. IAS/Shunts: No atrial level shunt detected by color flow Doppler. Additional Comments: Spectral Doppler performed. Yates Decamp MD Electronically signed by Yates Decamp MD Signature Date/Time: 03/16/2023/9:16:07 PM    Final    EP STUDY  Result Date: 03/16/2023 See surgical note for result.   CARDIAC DATABASE: EKG: 03/18/2023: Atrial fibrillation 103 bpm, consider old anteroseptal infarct, lateral ST-T changes, without underlying injury pattern  Echocardiogram: 03/15/2023:  1. Left ventricular ejection fraction, by estimation, is 55 to 60%. The left ventricle has normal function. The left ventricle has no regional wall motion abnormalities. Left ventricular diastolic function could not be evaluated.   2. Right ventricular  systolic function is normal. The right ventricular size is normal. There is mildly elevated pulmonary artery systolic pressure.   3. Left atrial size was moderately dilated.   4. Right  atrial size was moderately dilated.   5. The mitral valve is normal in structure. Mild to moderate mitral valve regurgitation. No evidence of mitral stenosis.   6. The aortic valve is normal in structure. Aortic valve regurgitation is not visualized. No aortic stenosis is present.   7. The inferior vena cava is dilated in size with <50% respiratory variability, suggesting right atrial pressure of 15 mmHg.   Stress test: None  Heart catheterization: None  Scheduled Meds:  acetaminophen  1,000 mg Oral Q6H   amoxicillin-clavulanate  1 tablet Oral Q12H   diltiazem  60 mg Oral Q8H   furosemide  20 mg Intravenous Daily   gabapentin  300 mg Oral TID   guaiFENesin  600 mg Oral BID   ibuprofen  800 mg Oral TID   ipratropium-albuterol  3 mL Nebulization QID   lidocaine  1 patch Transdermal Q24H   methocarbamol  1,000 mg Oral TID   nicotine  14 mg Transdermal Daily   psyllium  1 packet Oral BID   rosuvastatin  10 mg Oral Daily    Continuous Infusions:  amiodarone 60 mg/hr (03/18/23 1420)   Followed by   amiodarone     heparin 1,550 Units/hr (03/18/23 1455)   methocarbamol (ROBAXIN) IV     ondansetron (ZOFRAN) IV      PRN Meds: albuterol, alum & mag hydroxide-simeth, bisacodyl, guaiFENesin-dextromethorphan, HYDROmorphone (DILAUDID) injection, magic mouthwash, menthol-cetylpyridinium, methocarbamol (ROBAXIN) IV, metoprolol tartrate, naLOXone (NARCAN)  injection, naphazoline-glycerin, ondansetron (ZOFRAN) IV **OR** ondansetron (ZOFRAN) IV, oxyCODONE, phenol, simethicone, sodium chloride   IMPRESSION & RECOMMENDATIONS: Kristopher [name] is a 62 y.o. Caucasian male whose past medical history and cardiac risk factors include: Cigarette smoking, aortic atherosclerosis.  Impression: A-fib with RVR-newly  discovered Cigarette smoking. Aortic atherosclerosis. Mitral regurgitation. Blunt trauma to the chest status post fall Left lateral rib fractures Left apical pneumothorax  Leukocytosis  Recommendations: Atrial fibrillation with rapid ventricular rate S/p fall (present on arrival) leading to left lateral rib fractures and small pneumothorax. Clinically was noted to be newly discovered atrial fibrillation with rapid ventricular rate. Difficult to maintain rate control and/or transition to sinus with AV nodal blocking agents Underwent TEE guided cardioversion on 03/16/2023 Now illustrate ERAF s/p cardioversion.  Echocardiogram notes moderately dilated left atrium (either secondary to MR, Afib, etc) Discussed the role of antiarrhythmic medications to help restore and maintain sinus rhythm while he recuperates from rib fractures/pneumothorax. Antiarrhythmics considered are flecainide, sotalol, Multaq, and amiodarone. Given the A-fib with RVR did not start Multaq. Patient has had a prolonged history of cigarette smoking and has aortic atherosclerosis -CAD status is unknown and therefore not an ideal candidate for flecainide at this point. Shared decision was to start amiodarone at least for short period of time -given the medication profile I do not recommend long-term use of amiodarone. TSH and AST ALT within normal limits. Medication profile discussed with the patient. Click Here to Calculate/Change CHADS2VASc Score The patient's CHADS2-VASc score is 1, indicating a 0.6% annual risk of stroke.   CHF History: No HTN History: No Diabetes History: No Stroke History: No Vascular Disease History: Yes Will need to be converted to DOAC prior to discharge.   Given the recent cardioversion (03/16/2023) recommend 4 weeks of anticoagulation at least - long term duration needs to be addressed as outpatient based on how he does clinically.  May benefit from outpt monitoring of Afib (given her LA  size, ERAF, etc)   Blunt trauma to the chest status post fall Left  lateral rib fractures Left apical pneumothorax Trauma team following.  Mitral regurgitation Chest x-ray illustrates pulmonary vascular congestion, noted to have mild to moderate MR on echocardiography, and recent surface echo also noted estimated RAP 15 mmHg.  Will check BNP.  Will start Lasix 20 mg IV push p.o. daily with holding parameters.  BP soft on telemetry during the encounter.  Aortic atherosclerosis: Denies anginal chest pain. No obvious coronary artery calcification on recent CT chest. Consider ischemic workup as outpatient  Leukocytosis: Management per primary.  Spoke to both primary and trauma service regarding his care.   Patient's questions and concerns were addressed to his satisfaction. He voices understanding of the instructions provided during this encounter.   This note was created using a voice recognition software as a result there may be grammatical errors inadvertently enclosed that do not reflect the nature of this encounter. Every attempt is made to correct such errors.  Delilah Shan [facility]  Pager:  [phone] Office: [phone] 03/18/2023, 3:34 PM

## 2023-03-18 NOTE — Progress Notes (Signed)
ANTICOAGULATION CONSULT NOTE- Follow Up  Pharmacy Consult for Heparin Indication: atrial fibrillation  No Known Allergies  Patient Measurements: Height: 5\' 11"  (180.3 cm) Weight: 62.1 kg (137 lb) IBW/kg (Calculated) : 75.3 Heparin Dosing Weight: 62 kg  Vital Signs: Temp: 97.9 F (36.6 C) (09/15 0400) Temp Source: Axillary (09/15 0400) BP: 110/64 (09/15 0400) Pulse Rate: 75 (09/15 0400)  Labs: Recent Labs    03/16/23 1246 03/16/23 1834 03/17/23 0518 03/17/23 1248 03/17/23 2310 03/18/23 0830  HGB 14.7  --  14.2  --   --  14.3  HCT 44.5  --  42.3  --   --  42.7  PLT 248  --  233  --   --  260  HEPARINUNFRC  --    < > 0.18* 0.10* 0.30 0.38  CREATININE 0.75  --  0.75  --   --   --    < > = values in this interval not displayed.    Estimated Creatinine Clearance: 83 mL/min (by C-G formula based on SCr of 0.75 mg/dL).  Assessment: 62 y.o. male with no PMH who is admitted presented after a fall from a ladder with with left 7 through 9 rib fractures and small left pneumothorax, found to be in A-fib with RVR. Pharmacy consulted to manage heparin.  Heparin level is therapeutic (0.38) on infusion at 1550 units/hr. No interruptions with infusion or issues with bleeding reported per RN. CBC stable.  Goal of Therapy:  Heparin level 0.3-0.7 units/ml Monitor platelets by anticoagulation protocol: Yes   Plan:  Continue heparin infusion on 1550 units / hr Daily heparin level while on heparin Monitor CBC and s/sx of bleeding F/u plans to transition to DOAC  Nicole Kindred, PharmD PGY1 Pharmacy Resident 03/18/2023 9:14 AM

## 2023-03-18 NOTE — Progress Notes (Signed)
Progress Note  Patient Name: Kristopher [name] MRN: [id number] DOB: 09/06/1960 Date of Encounter: 03/18/2023  Attending physician: Maretta Bees, MD Primary care provider: Patient, No Pcp Per  Subjective: Kristopher [name] is a 62 y.o. Caucasian male who was seen and examined at bedside  Cardiology asked to reevaluate the patient has reverted back to A-fib with RVR Denies chest pain or heart failure symptoms.   Objective: Vital Signs in the last 24 hours: Temp:  [97.9 F (36.6 C)-98.6 F (37 C)] 98 F (36.7 C) (09/15 1200) Pulse Rate:  [75-81] 75 (09/15 0400) Resp:  [15-18] 15 (09/15 0400) BP: (110-132)/(64-94) 125/94 (09/15 1043) SpO2:  [83 %-95 %] 95 % (09/15 0833) FiO2 (%):  [21 %] 21 % (09/15 0833)  Intake/Output: No intake or output data in the 24 hours ending 03/18/23 1534  Net IO Since Admission: 1,601.32 mL [03/18/23 1534]  Weights:     03/14/2023    1:37 PM 07/29/2016   10:08 AM  Last 3 Weights  Weight (lbs) 137 lb 139 lb 6.4 oz  Weight (kg) 62.143 kg 63.231 kg      Telemetry:  Overnight telemetry shows atrial fibrillation with controlled ventricular rate (earlier this morning A-fib with RVR), which I personally reviewed.   Physical examination: PHYSICAL EXAM: Vitals:   03/18/23 0833 03/18/23 0934 03/18/23 1043 03/18/23 1200  BP:  132/73 (!) 125/94   Pulse:      Resp:      Temp:    98 F (36.7 C)  TempSrc:    Oral  SpO2: 95%     Weight:      Height:        Physical Exam  Constitutional: No distress.  Age appropriate, hemodynamically stable.   Neck: No JVD present.  Cardiovascular: S1 normal, S2 normal, intact distal pulses and normal pulses. An irregularly irregular rhythm present. Tachycardia present.  No murmurs rubs or gallops appreciated secondary to tachycardia  Pulmonary/Chest: Effort normal. No stridor. He has no wheezes. He has bibasilar rales. Tenderness is present which mimics chest pain.  Abdominal: Soft. Bowel sounds are normal. He  exhibits no distension. There is no abdominal tenderness.  Musculoskeletal:        General: No edema.     Cervical back: Neck supple.  Neurological: He is alert and oriented to person, place, and time. He has intact cranial nerves (2-12).  Skin: Skin is warm and moist.    Lab Results: Chemistry Recent Labs  Lab 03/14/23 1653 03/15/23 0649 03/16/23 1246 03/17/23 0518  NA 136 133* 136 137  K 5.0 4.6 4.2 3.9  CL 100 102 98 99  CO2 25 25 27 27   GLUCOSE 109* 129* 87 96  BUN 15 15 13 13   CREATININE 0.97 0.69 0.75 0.75  CALCIUM 9.7 9.1 9.1 8.8*  PROT 7.9  --   --   --   ALBUMIN 4.7  --   --   --   AST 31  --   --   --   ALT 28  --   --   --   ALKPHOS 83  --   --   --   BILITOT 1.0  --   --   --   GFRNONAA >60 >60 >60 >60  ANIONGAP 11 6 11 11     Hematology Recent Labs  Lab 03/16/23 1246 03/17/23 0518 03/18/23 0830  WBC 13.4* 13.8* 15.1*  RBC 4.33 4.11* 4.19*  HGB 14.7 14.2 14.3  HCT 44.5 42.3  42.7  MCV 102.8* 102.9* 101.9*  MCH 33.9 34.5* 34.1*  MCHC 33.0 33.6 33.5  RDW 12.0 12.1 12.0  PLT 248 233 260   High Sensitivity Troponin:  No results for input(s): "TROPONINIHS" in the last 720 hours.   Cardiac EnzymesNo results for input(s): "TROPONINI" in the last 168 hours. No results for input(s): "TROPIPOC" in the last 168 hours.  BNPNo results for input(s): "BNP", "PROBNP" in the last 168 hours.  DDimer No results for input(s): "DDIMER" in the last 168 hours.  Hemoglobin A1c: No results found for: "HGBA1C", "MPG" TSH  Recent Labs    03/15/23 1332  TSH 1.470   Lipid Panel  Lab Results  Component Value Date   CHOL 164 03/16/2023   HDL 90 03/16/2023   LDLCALC 68 03/16/2023   TRIG 32 03/16/2023   CHOLHDL 1.8 03/16/2023   Drugs of Abuse  No results found for: "LABOPIA", "COCAINSCRNUR", "LABBENZ", "AMPHETMU", "THCU", "LABBARB"    Imaging: DG CHEST PORT 1 VIEW  Result Date: 03/18/2023 CLINICAL DATA:  Evaluate pneumothorax EXAM: PORTABLE CHEST 1 VIEW  COMPARISON:  03/17/2023 FINDINGS: Small pneumothorax overlying the left upper lobe is again noted. This measures approximately 1.7 cm over the left apex compared with 2.9 cm previously. Heart size appears normal. Aortic atherosclerosis. Bilateral peripheral increased septal markings are identified compatible with pulmonary edema. Improved appearance of left lower lung atelectasis. IMPRESSION: 1. Slight decrease in size of small left apical pneumothorax. 2. Improved appearance of left lower lung atelectasis. 3. Persistent pulmonary edema. Electronically Signed   By: Signa Kell M.D.   On: 03/18/2023 09:08   DG Chest Port 1 View  Result Date: 03/17/2023 CLINICAL DATA:  141880 SOB (shortness of breath) 141880 EXAM: PORTABLE CHEST - 1 VIEW COMPARISON:  03/16/2023 FINDINGS: Patchy interstitial and airspace opacities in both lung bases have increased since previous. Slight apparent increase in the left apical pneumothorax, lung apex projecting at the posterior aspect third rib. Heart size and mediastinal contours are within normal limits. Aortic Atherosclerosis (ICD10-170.0). No effusion. Visualized bones unremarkable. IMPRESSION: 1. Slight increase in left apical pneumothorax. 2. Increasing bibasilar infiltrates. Electronically Signed   By: Corlis Leak M.D.   On: 03/17/2023 10:16   ECHO TEE  Result Date: 03/16/2023    TRANSESOPHOGEAL ECHO REPORT   Patient Name:   Kristopher [name] [facility] Date of Exam: 03/16/2023 Medical Rec #:  [id number]        Height:       71.0 in Accession #:    [id number]       Weight:       137.0 lb Date of Birth:  1961-02-07         BSA:          1.795 m Patient Age:    62 years         BP:           150/103 mmHg Patient Gender: M                HR:           124 bpm. Exam Location:  Inpatient Procedure: Transesophageal Echo, Color Doppler and Cardiac Doppler Indications:     I48.91* Unspecified atrial fibrillation  History:         Patient has prior history of Echocardiogram examinations, most                   recent 03/15/2023. Arrythmias:Atrial Fibrillation.  Sonographer:     Irving Burton Senior  42.7  MCV 102.8* 102.9* 101.9*  MCH 33.9 34.5* 34.1*  MCHC 33.0 33.6 33.5  RDW 12.0 12.1 12.0  PLT 248 233 260   High Sensitivity Troponin:  No results for input(s): "TROPONINIHS" in the last 720 hours.   Cardiac EnzymesNo results for input(s): "TROPONINI" in the last 168 hours. No results for input(s): "TROPIPOC" in the last 168 hours.  BNPNo results for input(s): "BNP", "PROBNP" in the last 168 hours.  DDimer No results for input(s): "DDIMER" in the last 168 hours.  Hemoglobin A1c: No results found for: "HGBA1C", "MPG" TSH  Recent Labs    03/15/23 1332  TSH 1.470   Lipid Panel  Lab Results  Component Value Date   CHOL 164 03/16/2023   HDL 90 03/16/2023   LDLCALC 68 03/16/2023   TRIG 32 03/16/2023   CHOLHDL 1.8 03/16/2023   Drugs of Abuse  No results found for: "LABOPIA", "COCAINSCRNUR", "LABBENZ", "AMPHETMU", "THCU", "LABBARB"    Imaging: DG CHEST PORT 1 VIEW  Result Date: 03/18/2023 CLINICAL DATA:  Evaluate pneumothorax EXAM: PORTABLE CHEST 1 VIEW  COMPARISON:  03/17/2023 FINDINGS: Small pneumothorax overlying the left upper lobe is again noted. This measures approximately 1.7 cm over the left apex compared with 2.9 cm previously. Heart size appears normal. Aortic atherosclerosis. Bilateral peripheral increased septal markings are identified compatible with pulmonary edema. Improved appearance of left lower lung atelectasis. IMPRESSION: 1. Slight decrease in size of small left apical pneumothorax. 2. Improved appearance of left lower lung atelectasis. 3. Persistent pulmonary edema. Electronically Signed   By: Signa Kell M.D.   On: 03/18/2023 09:08   DG Chest Port 1 View  Result Date: 03/17/2023 CLINICAL DATA:  141880 SOB (shortness of breath) 141880 EXAM: PORTABLE CHEST - 1 VIEW COMPARISON:  03/16/2023 FINDINGS: Patchy interstitial and airspace opacities in both lung bases have increased since previous. Slight apparent increase in the left apical pneumothorax, lung apex projecting at the posterior aspect third rib. Heart size and mediastinal contours are within normal limits. Aortic Atherosclerosis (ICD10-170.0). No effusion. Visualized bones unremarkable. IMPRESSION: 1. Slight increase in left apical pneumothorax. 2. Increasing bibasilar infiltrates. Electronically Signed   By: Corlis Leak M.D.   On: 03/17/2023 10:16   ECHO TEE  Result Date: 03/16/2023    TRANSESOPHOGEAL ECHO REPORT   Patient Name:   Kristopher [name] [facility] Date of Exam: 03/16/2023 Medical Rec #:  [id number]        Height:       71.0 in Accession #:    [id number]       Weight:       137.0 lb Date of Birth:  1961-02-07         BSA:          1.795 m Patient Age:    62 years         BP:           150/103 mmHg Patient Gender: M                HR:           124 bpm. Exam Location:  Inpatient Procedure: Transesophageal Echo, Color Doppler and Cardiac Doppler Indications:     I48.91* Unspecified atrial fibrillation  History:         Patient has prior history of Echocardiogram examinations, most                   recent 03/15/2023. Arrythmias:Atrial Fibrillation.  Sonographer:     Irving Burton Senior  42.7  MCV 102.8* 102.9* 101.9*  MCH 33.9 34.5* 34.1*  MCHC 33.0 33.6 33.5  RDW 12.0 12.1 12.0  PLT 248 233 260   High Sensitivity Troponin:  No results for input(s): "TROPONINIHS" in the last 720 hours.   Cardiac EnzymesNo results for input(s): "TROPONINI" in the last 168 hours. No results for input(s): "TROPIPOC" in the last 168 hours.  BNPNo results for input(s): "BNP", "PROBNP" in the last 168 hours.  DDimer No results for input(s): "DDIMER" in the last 168 hours.  Hemoglobin A1c: No results found for: "HGBA1C", "MPG" TSH  Recent Labs    03/15/23 1332  TSH 1.470   Lipid Panel  Lab Results  Component Value Date   CHOL 164 03/16/2023   HDL 90 03/16/2023   LDLCALC 68 03/16/2023   TRIG 32 03/16/2023   CHOLHDL 1.8 03/16/2023   Drugs of Abuse  No results found for: "LABOPIA", "COCAINSCRNUR", "LABBENZ", "AMPHETMU", "THCU", "LABBARB"    Imaging: DG CHEST PORT 1 VIEW  Result Date: 03/18/2023 CLINICAL DATA:  Evaluate pneumothorax EXAM: PORTABLE CHEST 1 VIEW  COMPARISON:  03/17/2023 FINDINGS: Small pneumothorax overlying the left upper lobe is again noted. This measures approximately 1.7 cm over the left apex compared with 2.9 cm previously. Heart size appears normal. Aortic atherosclerosis. Bilateral peripheral increased septal markings are identified compatible with pulmonary edema. Improved appearance of left lower lung atelectasis. IMPRESSION: 1. Slight decrease in size of small left apical pneumothorax. 2. Improved appearance of left lower lung atelectasis. 3. Persistent pulmonary edema. Electronically Signed   By: Signa Kell M.D.   On: 03/18/2023 09:08   DG Chest Port 1 View  Result Date: 03/17/2023 CLINICAL DATA:  141880 SOB (shortness of breath) 141880 EXAM: PORTABLE CHEST - 1 VIEW COMPARISON:  03/16/2023 FINDINGS: Patchy interstitial and airspace opacities in both lung bases have increased since previous. Slight apparent increase in the left apical pneumothorax, lung apex projecting at the posterior aspect third rib. Heart size and mediastinal contours are within normal limits. Aortic Atherosclerosis (ICD10-170.0). No effusion. Visualized bones unremarkable. IMPRESSION: 1. Slight increase in left apical pneumothorax. 2. Increasing bibasilar infiltrates. Electronically Signed   By: Corlis Leak M.D.   On: 03/17/2023 10:16   ECHO TEE  Result Date: 03/16/2023    TRANSESOPHOGEAL ECHO REPORT   Patient Name:   Kristopher [name] [facility] Date of Exam: 03/16/2023 Medical Rec #:  [id number]        Height:       71.0 in Accession #:    [id number]       Weight:       137.0 lb Date of Birth:  1961-02-07         BSA:          1.795 m Patient Age:    62 years         BP:           150/103 mmHg Patient Gender: M                HR:           124 bpm. Exam Location:  Inpatient Procedure: Transesophageal Echo, Color Doppler and Cardiac Doppler Indications:     I48.91* Unspecified atrial fibrillation  History:         Patient has prior history of Echocardiogram examinations, most                   recent 03/15/2023. Arrythmias:Atrial Fibrillation.  Sonographer:     Irving Burton Senior  Progress Note  Patient Name: Kristopher [name] MRN: [id number] DOB: 09/06/1960 Date of Encounter: 03/18/2023  Attending physician: Maretta Bees, MD Primary care provider: Patient, No Pcp Per  Subjective: Kristopher [name] is a 62 y.o. Caucasian male who was seen and examined at bedside  Cardiology asked to reevaluate the patient has reverted back to A-fib with RVR Denies chest pain or heart failure symptoms.   Objective: Vital Signs in the last 24 hours: Temp:  [97.9 F (36.6 C)-98.6 F (37 C)] 98 F (36.7 C) (09/15 1200) Pulse Rate:  [75-81] 75 (09/15 0400) Resp:  [15-18] 15 (09/15 0400) BP: (110-132)/(64-94) 125/94 (09/15 1043) SpO2:  [83 %-95 %] 95 % (09/15 0833) FiO2 (%):  [21 %] 21 % (09/15 0833)  Intake/Output: No intake or output data in the 24 hours ending 03/18/23 1534  Net IO Since Admission: 1,601.32 mL [03/18/23 1534]  Weights:     03/14/2023    1:37 PM 07/29/2016   10:08 AM  Last 3 Weights  Weight (lbs) 137 lb 139 lb 6.4 oz  Weight (kg) 62.143 kg 63.231 kg      Telemetry:  Overnight telemetry shows atrial fibrillation with controlled ventricular rate (earlier this morning A-fib with RVR), which I personally reviewed.   Physical examination: PHYSICAL EXAM: Vitals:   03/18/23 0833 03/18/23 0934 03/18/23 1043 03/18/23 1200  BP:  132/73 (!) 125/94   Pulse:      Resp:      Temp:    98 F (36.7 C)  TempSrc:    Oral  SpO2: 95%     Weight:      Height:        Physical Exam  Constitutional: No distress.  Age appropriate, hemodynamically stable.   Neck: No JVD present.  Cardiovascular: S1 normal, S2 normal, intact distal pulses and normal pulses. An irregularly irregular rhythm present. Tachycardia present.  No murmurs rubs or gallops appreciated secondary to tachycardia  Pulmonary/Chest: Effort normal. No stridor. He has no wheezes. He has bibasilar rales. Tenderness is present which mimics chest pain.  Abdominal: Soft. Bowel sounds are normal. He  exhibits no distension. There is no abdominal tenderness.  Musculoskeletal:        General: No edema.     Cervical back: Neck supple.  Neurological: He is alert and oriented to person, place, and time. He has intact cranial nerves (2-12).  Skin: Skin is warm and moist.    Lab Results: Chemistry Recent Labs  Lab 03/14/23 1653 03/15/23 0649 03/16/23 1246 03/17/23 0518  NA 136 133* 136 137  K 5.0 4.6 4.2 3.9  CL 100 102 98 99  CO2 25 25 27 27   GLUCOSE 109* 129* 87 96  BUN 15 15 13 13   CREATININE 0.97 0.69 0.75 0.75  CALCIUM 9.7 9.1 9.1 8.8*  PROT 7.9  --   --   --   ALBUMIN 4.7  --   --   --   AST 31  --   --   --   ALT 28  --   --   --   ALKPHOS 83  --   --   --   BILITOT 1.0  --   --   --   GFRNONAA >60 >60 >60 >60  ANIONGAP 11 6 11 11     Hematology Recent Labs  Lab 03/16/23 1246 03/17/23 0518 03/18/23 0830  WBC 13.4* 13.8* 15.1*  RBC 4.33 4.11* 4.19*  HGB 14.7 14.2 14.3  HCT 44.5 42.3

## 2023-03-18 NOTE — Plan of Care (Signed)
Problem: Clinical Measurements: Goal: Ability to maintain clinical measurements within normal limits will improve Outcome: Progressing Goal: Diagnostic test results will improve Outcome: Progressing Goal: Respiratory complications will improve Outcome: Progressing Goal: Cardiovascular complication will be avoided Outcome: Progressing   Problem: Pain Managment: Goal: General experience of comfort will improve Outcome: Progressing

## 2023-03-18 NOTE — Plan of Care (Signed)
Pt has rested quietly throughout the night with no distress noted. Alert and oriented. On room air. Up ad lib in hall and to BR. No complaints voiced.     Problem: Education: Goal: Knowledge of General Education information will improve Description: Including pain rating scale, medication(s)/side effects and non-pharmacologic comfort measures Outcome: Progressing   Problem: Health Behavior/Discharge Planning: Goal: Ability to manage health-related needs will improve Outcome: Progressing   Problem: Clinical Measurements: Goal: Ability to maintain clinical measurements within normal limits will improve Outcome: Progressing Goal: Will remain free from infection Outcome: Progressing Goal: Diagnostic test results will improve Outcome: Progressing Goal: Respiratory complications will improve Outcome: Progressing Goal: Cardiovascular complication will be avoided Outcome: Progressing   Problem: Activity: Goal: Risk for activity intolerance will decrease Outcome: Progressing   Problem: Nutrition: Goal: Adequate nutrition will be maintained Outcome: Progressing   Problem: Coping: Goal: Level of anxiety will decrease Outcome: Progressing   Problem: Elimination: Goal: Will not experience complications related to bowel motility Outcome: Progressing Goal: Will not experience complications related to urinary retention Outcome: Progressing   Problem: Pain Managment: Goal: General experience of comfort will improve Outcome: Progressing   Problem: Safety: Goal: Ability to remain free from injury will improve Outcome: Progressing   Problem: Skin Integrity: Goal: Risk for impaired skin integrity will decrease Outcome: Progressing

## 2023-03-18 NOTE — Progress Notes (Signed)
Progress Note  2 Days Post-Op  Subjective: Called back for increased PTX after patient had episode of shortness of breath.    Objective: Vital signs in last 24 hours: Temp:  [97.6 F (36.4 C)-99 F (37.2 C)] 97.9 F (36.6 C) (09/15 0400) Pulse Rate:  [75-82] 75 (09/15 0400) Resp:  [15-20] 15 (09/15 0400) BP: (108-132)/(64-73) 132/73 (09/15 0934) SpO2:  [83 %-95 %] 83 % (09/15 0400)    Intake/Output from previous day: No intake/output data recorded. Intake/Output this shift: No intake/output data recorded.  PE: General: pleasant, WD, WN male who is sitting up in NAD Heart: irregularly irregular Lungs: CTAB, no wheezes, rhonchi, or rales noted.  Respiratory effort nonlabored.  Good breath sounds to top of lungs.  Abd: soft, NT, ND MS: moving all 4 extremities Psych: A&Ox3 with an appropriate affect.    Lab Results:  Recent Labs    03/17/23 0518 03/18/23 0830  WBC 13.8* 15.1*  HGB 14.2 14.3  HCT 42.3 42.7  PLT 233 260   BMET Recent Labs    03/16/23 1246 03/17/23 0518  NA 136 137  K 4.2 3.9  CL 98 99  CO2 27 27  GLUCOSE 87 96  BUN 13 13  CREATININE 0.75 0.75  CALCIUM 9.1 8.8*   PT/INR No results for input(s): "LABPROT", "INR" in the last 72 hours. CMP     Component Value Date/Time   NA 137 03/17/2023 0518   K 3.9 03/17/2023 0518   CL 99 03/17/2023 0518   CO2 27 03/17/2023 0518   GLUCOSE 96 03/17/2023 0518   BUN 13 03/17/2023 0518   CREATININE 0.75 03/17/2023 0518   CREATININE 0.69 (L) 07/29/2016 1201   CALCIUM 8.8 (L) 03/17/2023 0518   PROT 7.9 03/14/2023 1653   ALBUMIN 4.7 03/14/2023 1653   AST 31 03/14/2023 1653   ALT 28 03/14/2023 1653   ALKPHOS 83 03/14/2023 1653   BILITOT 1.0 03/14/2023 1653   GFRNONAA >60 03/17/2023 0518   GFRNONAA >89 07/29/2016 1201   GFRAA >89 07/29/2016 1201   Lipase  No results found for: "LIPASE"     Studies/Results: DG CHEST PORT 1 VIEW  Result Date: 03/18/2023 CLINICAL DATA:  Evaluate pneumothorax  EXAM: PORTABLE CHEST 1 VIEW COMPARISON:  03/17/2023 FINDINGS: Small pneumothorax overlying the left upper lobe is again noted. This measures approximately 1.7 cm over the left apex compared with 2.9 cm previously. Heart size appears normal. Aortic atherosclerosis. Bilateral peripheral increased septal markings are identified compatible with pulmonary edema. Improved appearance of left lower lung atelectasis. IMPRESSION: 1. Slight decrease in size of small left apical pneumothorax. 2. Improved appearance of left lower lung atelectasis. 3. Persistent pulmonary edema. Electronically Signed   By: Signa Kell M.D.   On: 03/18/2023 09:08   DG Chest Port 1 View  Result Date: 03/17/2023 CLINICAL DATA:  141880 SOB (shortness of breath) [id number] EXAM: PORTABLE CHEST - 1 VIEW COMPARISON:  03/16/2023 FINDINGS: Patchy interstitial and airspace opacities in both lung bases have increased since previous. Slight apparent increase in the left apical pneumothorax, lung apex projecting at the posterior aspect third rib. Heart size and mediastinal contours are within normal limits. Aortic Atherosclerosis (ICD10-170.0). No effusion. Visualized bones unremarkable. IMPRESSION: 1. Slight increase in left apical pneumothorax. 2. Increasing bibasilar infiltrates. Electronically Signed   By: Corlis Leak M.D.   On: 03/17/2023 10:16   ECHO TEE  Result Date: 03/16/2023    TRANSESOPHOGEAL ECHO REPORT   Patient Name:   Kristopher [name] [facility]  Date of Exam: 03/16/2023 Medical Rec #:  [id number]        Height:       71.0 in Accession #:    [id number]       Weight:       137.0 lb Date of Birth:  1961-01-05         BSA:          1.795 m Patient Age:    62 years         BP:           150/103 mmHg Patient Gender: M                HR:           124 bpm. Exam Location:  Inpatient Procedure: Transesophageal Echo, Color Doppler and Cardiac Doppler Indications:     I48.91* Unspecified atrial fibrillation  History:         Patient has prior history of  Echocardiogram examinations, most                  recent 03/15/2023. Arrythmias:Atrial Fibrillation.  Sonographer:     Irving Burton Senior RDCS Referring Phys:  [id number] Yates Decamp Diagnosing Phys: Yates Decamp MD PROCEDURE: After discussion of the risks and benefits of a TEE, an informed consent was obtained from the patient. The transesophogeal probe was passed without difficulty through the esophogus of the patient. Sedation performed by different physician. The patient was monitored while under deep sedation. Anesthestetic sedation was provided intravenously by Anesthesiology: 150mg  of Propofol. The patient's vital signs; including heart rate, blood pressure, and oxygen saturation; remained stable throughout the procedure. The patient developed no complications during the procedure. A successful direct current cardioversion was performed at 150 joules with 1 attempt.  IMPRESSIONS  1. Left ventricular ejection fraction, by estimation, is 60 to 65%. The left ventricle has normal function. The left ventricle has no regional wall motion abnormalities.  2. Right ventricular systolic function is normal. The right ventricular size is normal.  3. No left atrial/left atrial appendage thrombus was detected. The LAA emptying velocity was 26 cm/s.  4. The mitral valve is normal in structure. Mild mitral valve regurgitation. No evidence of mitral stenosis.  5. The aortic valve is normal in structure. Aortic valve regurgitation is not visualized. No aortic stenosis is present.  6. The inferior vena cava is normal in size with greater than 50% respiratory variability, suggesting right atrial pressure of 3 mmHg. Conclusion(s)/Recommendation(s): Normal biventricular function without evidence of hemodynamically significant valvular heart disease. FINDINGS  Left Ventricle: Left ventricular ejection fraction, by estimation, is 60 to 65%. The left ventricle has normal function. The left ventricle has no regional wall motion abnormalities. The left  ventricular internal cavity size was normal in size. There is  no left ventricular hypertrophy. Right Ventricle: The right ventricular size is normal. No increase in right ventricular wall thickness. Right ventricular systolic function is normal. Left Atrium: Left atrial size was normal in size. No left atrial/left atrial appendage thrombus was detected. The LAA emptying velocity was 26 cm/s. Right Atrium: Right atrial size was normal in size. Pericardium: There is no evidence of pericardial effusion. Mitral Valve: The mitral valve is normal in structure. Mild mitral valve regurgitation. No evidence of mitral valve stenosis. Tricuspid Valve: The tricuspid valve is normal in structure. Tricuspid valve regurgitation is not demonstrated. No evidence of tricuspid stenosis. Aortic Valve: The aortic valve is normal in structure. Aortic valve regurgitation is not visualized.  No aortic stenosis is present. Pulmonic Valve: The pulmonic valve was normal in structure. Pulmonic valve regurgitation is not visualized. No evidence of pulmonic stenosis. Aorta: The aortic root is normal in size and structure. Venous: The inferior vena cava is normal in size with greater than 50% respiratory variability, suggesting right atrial pressure of 3 mmHg. IAS/Shunts: No atrial level shunt detected by color flow Doppler. Additional Comments: Spectral Doppler performed. Yates Decamp MD Electronically signed by Yates Decamp MD Signature Date/Time: 03/16/2023/9:16:07 PM    Final    EP STUDY  Result Date: 03/16/2023 See surgical note for result.   Anti-infectives: Anti-infectives (From admission, onward)    Start     Dose/Rate Route Frequency Ordered Stop   03/17/23 1145  amoxicillin-clavulanate (AUGMENTIN) 875-125 MG per tablet 1 tablet        1 tablet Oral Every 12 hours 03/17/23 1048          Assessment/Plan  Fall off ladder Rib 7-9 fxs with small PTX - CXR with slight improvement in PTX today.  - continue multimodal pain control  for rib fractures, pt reports good pain control currently  - follow up with PCP in 1-2 weeks from hospital DC to assess pain control   A fib with RVR - per primary, was cardioverted. Back in rapid afib again.   Was hopeful for home today, but patient in afib with RVR again. Discussed with Dr Odis Hollingshead of cardiology who will see patient.   Repeat CXR in AM assuming patient is still here.    NWG:NFAO as tolerated from surgery standpoint.  IVF per primary  VTE: hep gtt, ok for transition to oral anticoagulation as desired per primary team.  ID: no current abx   LOS: 4 days   I reviewed nursing notes, hospitalist notes, last 24 h vitals and pain scores, last 48 h intake and output, last 24 h labs and trends, and last 24 h imaging results.    Almond Lint, MD [facility] Surgery 03/18/2023, 10:24 AM Please see Amion for pager number during day hours 7:00am-4:30pm

## 2023-03-19 ENCOUNTER — Inpatient Hospital Stay (HOSPITAL_COMMUNITY): Payer: 59

## 2023-03-19 ENCOUNTER — Encounter (HOSPITAL_COMMUNITY)
Admission: EM | Disposition: A | Discharge status: Home / Self Care | Payer: Self-pay | Source: Home / Self Care | Attending: Internal Medicine

## 2023-03-19 ENCOUNTER — Other Ambulatory Visit (HOSPITAL_COMMUNITY): Payer: Self-pay

## 2023-03-19 ENCOUNTER — Inpatient Hospital Stay (HOSPITAL_COMMUNITY): Payer: 59 | Admitting: Anesthesiology

## 2023-03-19 ENCOUNTER — Encounter (HOSPITAL_COMMUNITY): Payer: Self-pay | Admitting: Cardiology

## 2023-03-19 DIAGNOSIS — J939 Pneumothorax, unspecified: Secondary | ICD-10-CM | POA: Diagnosis not present

## 2023-03-19 DIAGNOSIS — D7589 Other specified diseases of blood and blood-forming organs: Secondary | ICD-10-CM | POA: Diagnosis not present

## 2023-03-19 DIAGNOSIS — I4891 Unspecified atrial fibrillation: Secondary | ICD-10-CM

## 2023-03-19 DIAGNOSIS — I4819 Other persistent atrial fibrillation: Secondary | ICD-10-CM

## 2023-03-19 HISTORY — PX: CARDIOVERSION: SHX1299

## 2023-03-19 LAB — CBC WITH DIFFERENTIAL/PLATELET
Abs Immature Granulocytes: 0.09 10*3/uL — ABNORMAL HIGH (ref 0.00–0.07)
Basophils Absolute: 0.1 10*3/uL (ref 0.0–0.1)
Basophils Relative: 1 %
Eosinophils Absolute: 0.5 10*3/uL (ref 0.0–0.5)
Eosinophils Relative: 4 %
HCT: 41.5 % (ref 39.0–52.0)
Hemoglobin: 14.1 g/dL (ref 13.0–17.0)
Immature Granulocytes: 1 %
Lymphocytes Relative: 7 %
Lymphs Abs: 0.8 10*3/uL (ref 0.7–4.0)
MCH: 35.4 pg — ABNORMAL HIGH (ref 26.0–34.0)
MCHC: 34 g/dL (ref 30.0–36.0)
MCV: 104.3 fL — ABNORMAL HIGH (ref 80.0–100.0)
Monocytes Absolute: 1.5 10*3/uL — ABNORMAL HIGH (ref 0.1–1.0)
Monocytes Relative: 13 %
Neutro Abs: 8.8 10*3/uL — ABNORMAL HIGH (ref 1.7–7.7)
Neutrophils Relative %: 74 %
Platelets: 251 10*3/uL (ref 150–400)
RBC: 3.98 MIL/uL — ABNORMAL LOW (ref 4.22–5.81)
RDW: 12.2 % (ref 11.5–15.5)
WBC: 11.7 10*3/uL — ABNORMAL HIGH (ref 4.0–10.5)
nRBC: 0 % (ref 0.0–0.2)

## 2023-03-19 LAB — BASIC METABOLIC PANEL
Anion gap: 10 (ref 5–15)
BUN: 11 mg/dL (ref 8–23)
CO2: 26 mmol/L (ref 22–32)
Calcium: 8.7 mg/dL — ABNORMAL LOW (ref 8.9–10.3)
Chloride: 102 mmol/L (ref 98–111)
Creatinine, Ser: 0.9 mg/dL (ref 0.61–1.24)
GFR, Estimated: >60 mL/min (ref >60)
Glucose, Bld: 99 mg/dL (ref 70–99)
Potassium: 3.9 mmol/L (ref 3.5–5.1)
Sodium: 138 mmol/L (ref 135–145)

## 2023-03-19 LAB — MAGNESIUM: Magnesium: 1.8 mg/dL (ref 1.7–2.4)

## 2023-03-19 LAB — HEPARIN LEVEL (UNFRACTIONATED): Heparin Unfractionated: 0.14 [IU]/mL — ABNORMAL LOW (ref 0.30–0.70)

## 2023-03-19 LAB — HEMOGLOBIN A1C
Hgb A1c MFr Bld: 5.5 % (ref 4.8–5.6)
Mean Plasma Glucose: 111 mg/dL

## 2023-03-19 SURGERY — CARDIOVERSION
Anesthesia: General

## 2023-03-19 MED ORDER — IPRATROPIUM-ALBUTEROL 0.5-2.5 (3) MG/3ML IN SOLN: 3.0000 mL | Freq: Four times a day (QID) | RESPIRATORY_TRACT | Status: DC | PRN

## 2023-03-19 MED ORDER — AMOXICILLIN-POT CLAVULANATE 875-125 MG PO TABS
1.0000 | ORAL_TABLET | Freq: Two times a day (BID) | ORAL | 0 refills | Status: AC
  Filled 2023-03-19: qty 4, 2d supply, fill #0

## 2023-03-19 MED ORDER — APIXABAN 5 MG PO TABS: 5.0000 mg | ORAL_TABLET | Freq: Two times a day (BID) | ORAL | Status: DC

## 2023-03-19 MED ORDER — PROPOFOL 10 MG/ML IV BOLUS
INTRAVENOUS | Status: DC | PRN
  Administered 2023-03-19: 70 mg via INTRAVENOUS
  Administered 2023-03-19: 30 mg via INTRAVENOUS

## 2023-03-19 MED ORDER — DILTIAZEM HCL ER COATED BEADS 180 MG PO CP24
180.0000 mg | ORAL_CAPSULE | Freq: Every day | ORAL | 2 refills | Status: DC
  Filled 2023-03-19: qty 30, 30d supply, fill #0
  Filled 2023-04-13: qty 30, 30d supply, fill #1
  Filled 2023-04-13: qty 30, 30d supply, fill #0
  Filled 2023-05-15: qty 30, 30d supply, fill #1

## 2023-03-19 MED ORDER — AMIODARONE HCL 200 MG PO TABS
ORAL_TABLET | ORAL | 2 refills | Status: DC
Start: 2023-03-19 — End: 2023-08-08
  Filled 2023-03-19: qty 60, 53d supply, fill #0
  Filled 2023-04-13: qty 30, 30d supply, fill #0
  Filled 2023-04-13: qty 60, 30d supply, fill #1
  Filled 2023-06-07: qty 30, 30d supply, fill #1
  Filled 2023-07-02: qty 30, 30d supply, fill #2
  Filled 2023-08-06: qty 30, 30d supply, fill #3

## 2023-03-19 MED ORDER — DILTIAZEM HCL ER COATED BEADS 180 MG PO CP24
180.0000 mg | ORAL_CAPSULE | Freq: Every day | ORAL | Status: DC
  Administered 2023-03-19: 180 mg via ORAL
  Filled 2023-03-19: qty 1

## 2023-03-19 MED ORDER — SODIUM CHLORIDE 0.9 % IV SOLN: INTRAVENOUS | Status: DC

## 2023-03-19 MED ORDER — METOPROLOL TARTRATE 50 MG PO TABS
50.0000 mg | ORAL_TABLET | Freq: Two times a day (BID) | ORAL | 2 refills | Status: DC
  Filled 2023-03-19 – 2023-04-13 (×2): qty 60, 30d supply, fill #0
  Filled 2023-04-13: qty 60, 30d supply, fill #1
  Filled 2023-04-13: qty 60, 30d supply, fill #0
  Filled 2023-05-15: qty 60, 30d supply, fill #1

## 2023-03-19 MED ORDER — LIDOCAINE 2% (20 MG/ML) 5 ML SYRINGE
INTRAMUSCULAR | Status: DC | PRN
  Administered 2023-03-19: 60 mg via INTRAVENOUS

## 2023-03-19 MED ORDER — METOPROLOL TARTRATE 50 MG PO TABS
50.0000 mg | ORAL_TABLET | Freq: Two times a day (BID) | ORAL | Status: DC
  Administered 2023-03-19: 50 mg via ORAL
  Filled 2023-03-19: qty 1

## 2023-03-19 MED ORDER — HEPARIN (PORCINE) 25000 UT/250ML-% IV SOLN: 1650.0000 [IU]/h | INTRAVENOUS | Status: DC

## 2023-03-19 MED ORDER — APIXABAN 5 MG PO TABS
5.0000 mg | ORAL_TABLET | Freq: Two times a day (BID) | ORAL | 2 refills | Status: DC
Start: 2023-03-19 — End: 2023-06-13
  Filled 2023-03-19 – 2023-04-13 (×2): qty 60, 30d supply, fill #0
  Filled 2023-04-13 – 2023-05-15 (×2): qty 60, 30d supply, fill #1

## 2023-03-19 MED ORDER — APIXABAN 5 MG PO TABS
5.0000 mg | ORAL_TABLET | Freq: Two times a day (BID) | ORAL | Status: DC
  Administered 2023-03-19: 5 mg via ORAL
  Filled 2023-03-19: qty 1

## 2023-03-19 MED ORDER — AMIODARONE HCL 200 MG PO TABS
200.0000 mg | ORAL_TABLET | Freq: Two times a day (BID) | ORAL | Status: DC
  Administered 2023-03-19: 200 mg via ORAL
  Filled 2023-03-19: qty 1

## 2023-03-19 SURGICAL SUPPLY — 1 items: ELECT DEFIB PAD ADLT CADENCE (PAD) ×1 IMPLANT

## 2023-03-19 NOTE — Interval H&P Note (Signed)
History and Physical Interval Note:  03/19/2023 12:49 PM  Kristopher [name]  has presented today for surgery, with the diagnosis of afib.  The various methods of treatment have been discussed with the patient and family. After consideration of risks, benefits and other options for treatment, the patient has consented to  Procedure(s): CARDIOVERSION (N/A) as a surgical intervention.  The patient's history has been reviewed, patient examined, no change in status, stable for surgery.  I have reviewed the patient's chart and labs.  Questions were answered to the patient's satisfaction.     Yates Decamp

## 2023-03-19 NOTE — Discharge Summary (Signed)
PATIENT DETAILS Name: Kristopher [name] Age: 62 y.o. Sex: male Date of Birth: 1961/01/17 MRN: [id number]. Admitting Physician: John Giovanni, MD ION:[id number], No Pcp Per  Admit Date: 03/14/2023 Discharge date: 03/19/2023  Recommendations for Outpatient Follow-up:  Follow up with PCP in 1-2 weeks Please obtain CMP/CBC in one week Please ensure follow-up with cardiology and trauma clinic  Admitted From:  Home  Disposition: Home   Discharge Condition: good  CODE STATUS:   Code Status: Full Code   Diet recommendation:  Diet Order             Diet Heart Room service appropriate? Yes; Fluid consistency: Thin  Diet effective now           Diet - low sodium heart healthy                    Brief Summary: Patient is a 63 y.o.  male with?  Prior history of PAF-tobacco use-who fell from a ladder-he was found to have 7-9th left rib fracture-A-fib with RVR and subsequently admitted to the hospitalist service   Significant events: 9/11>> admit to [facility] 9/12>> transferred to [facility] for trauma service evaluation 9/13>> cardioversion with restoration of sinus rhythm 9/15>> back in A-fib RVR-amiodarone infusion started   Significant studies: 9/11>> x-ray left hip: Negative 9/11>> CXR: Left 7th-8th rib fracture-small left apical pneumothorax 9/11>> CT chest: Mildly displaced left seventh, eighth and ninth rib fracture, small left pneumothorax. 9/12>> echo: EF 55-60% 9/12>> TSH: 1.4   Significant microbiology data: None   Procedures: 9/13>> TEE/cardioversion   Consults: Trauma service Cards  Brief Hospital Course: Left 7th through 9th rib fractures Small apical left-sided pneumothorax Lung contusions Following a fall from a ladder-6 feet off the ground Daily CXR stable No Further recommendations from trauma service-they will arrange for outpatient follow-up and repeat chest x-ray Continue empiric Augmentin x 5 days-although suspect this is mostly lung  contusion and unlikely to be pneumonia.     Acute hypoxic respiratory failure Remains on room air Pulmonary toileting Augmentin x 5 days total   PAF with RVR S/p TEE/cardioversion on 9/13 with restoration of sinus rhythm-unfortunately-back in A-fib RVR on 9/15-subsequently started on amiodarone infusion.  Cardioversion reattempted on 9/16-unfortunately unsuccessful Discussed with Dr. Blythe Stanford to discharge on oral amiodarone 200 mg twice daily x 7 days then switch to 200 mg daily, Cardizem CD1 80 mg daily, metoprolol 50 mg twice daily and Eliquis.   Dr. Verl Dicker office will arrange for follow-up    Macrocytosis B12/folic acid levels stable   Mildly prolonged QTc interval Resolved as of EKG on 9/13   BMI: Estimated body mass index is 19.11 kg/m as calculated from the following:   Height as of this encounter: 5\' 11"  (1.803 m).   Weight as of this encounter: 62.1 kg.    Discharge Diagnoses:  Principal Problem:   Pneumothorax, left Active Problems:   Closed fracture of three ribs, left   Atrial fibrillation with rapid ventricular response (HCC)   Macrocytosis without anemia   QT prolongation   Leukocytosis   Nonrheumatic mitral valve regurgitation   Atherosclerosis of aorta (HCC)   Status post fall   Continuous dependence on cigarette smoking   Persistent atrial fibrillation [facility])   Discharge Instructions:  Activity:  As tolerated   Discharge Instructions     Call MD for:  difficulty breathing, headache or visual disturbances   Complete by: As directed    Call MD for:  extreme fatigue  PATIENT DETAILS Name: Kristopher [name] Age: 62 y.o. Sex: male Date of Birth: 1961/01/17 MRN: [id number]. Admitting Physician: John Giovanni, MD ION:[id number], No Pcp Per  Admit Date: 03/14/2023 Discharge date: 03/19/2023  Recommendations for Outpatient Follow-up:  Follow up with PCP in 1-2 weeks Please obtain CMP/CBC in one week Please ensure follow-up with cardiology and trauma clinic  Admitted From:  Home  Disposition: Home   Discharge Condition: good  CODE STATUS:   Code Status: Full Code   Diet recommendation:  Diet Order             Diet Heart Room service appropriate? Yes; Fluid consistency: Thin  Diet effective now           Diet - low sodium heart healthy                    Brief Summary: Patient is a 63 y.o.  male with?  Prior history of PAF-tobacco use-who fell from a ladder-he was found to have 7-9th left rib fracture-A-fib with RVR and subsequently admitted to the hospitalist service   Significant events: 9/11>> admit to [facility] 9/12>> transferred to [facility] for trauma service evaluation 9/13>> cardioversion with restoration of sinus rhythm 9/15>> back in A-fib RVR-amiodarone infusion started   Significant studies: 9/11>> x-ray left hip: Negative 9/11>> CXR: Left 7th-8th rib fracture-small left apical pneumothorax 9/11>> CT chest: Mildly displaced left seventh, eighth and ninth rib fracture, small left pneumothorax. 9/12>> echo: EF 55-60% 9/12>> TSH: 1.4   Significant microbiology data: None   Procedures: 9/13>> TEE/cardioversion   Consults: Trauma service Cards  Brief Hospital Course: Left 7th through 9th rib fractures Small apical left-sided pneumothorax Lung contusions Following a fall from a ladder-6 feet off the ground Daily CXR stable No Further recommendations from trauma service-they will arrange for outpatient follow-up and repeat chest x-ray Continue empiric Augmentin x 5 days-although suspect this is mostly lung  contusion and unlikely to be pneumonia.     Acute hypoxic respiratory failure Remains on room air Pulmonary toileting Augmentin x 5 days total   PAF with RVR S/p TEE/cardioversion on 9/13 with restoration of sinus rhythm-unfortunately-back in A-fib RVR on 9/15-subsequently started on amiodarone infusion.  Cardioversion reattempted on 9/16-unfortunately unsuccessful Discussed with Dr. Blythe Stanford to discharge on oral amiodarone 200 mg twice daily x 7 days then switch to 200 mg daily, Cardizem CD1 80 mg daily, metoprolol 50 mg twice daily and Eliquis.   Dr. Verl Dicker office will arrange for follow-up    Macrocytosis B12/folic acid levels stable   Mildly prolonged QTc interval Resolved as of EKG on 9/13   BMI: Estimated body mass index is 19.11 kg/m as calculated from the following:   Height as of this encounter: 5\' 11"  (1.803 m).   Weight as of this encounter: 62.1 kg.    Discharge Diagnoses:  Principal Problem:   Pneumothorax, left Active Problems:   Closed fracture of three ribs, left   Atrial fibrillation with rapid ventricular response (HCC)   Macrocytosis without anemia   QT prolongation   Leukocytosis   Nonrheumatic mitral valve regurgitation   Atherosclerosis of aorta (HCC)   Status post fall   Continuous dependence on cigarette smoking   Persistent atrial fibrillation [facility])   Discharge Instructions:  Activity:  As tolerated   Discharge Instructions     Call MD for:  difficulty breathing, headache or visual disturbances   Complete by: As directed    Call MD for:  extreme fatigue  LOPRESSOR Take 1 tablet (50 mg total) by mouth 2 (two) times daily.   nicotine polacrilex 4 MG gum Commonly known as: NICORETTE Take 4 mg by mouth as needed for smoking cessation.        Follow-up Information     Yates Decamp, MD Follow up in 4 week(s).   Specialty: Cardiology Why: Call to schedule appointment Contact information: [address] [place] [address] [phone]         [facility]. Schedule an appointment as soon as possible for a visit in 1 week(s).   Why: For follow up chest x-ray. If they have any trouble finding order, please have them call [facility] [place] [facility] at [phone] Contact information: [address] [place] [address]        [facility] Follow up.   Why: No follow up appointment scheduled but we will  call with results of outpatient chest x-ray Contact information: [address] [place] [address] [phone]               No Known Allergies   Other Procedures/Studies: EP STUDY  Result Date: 03/19/2023 See surgical note for result.  DG CHEST PORT 1 VIEW  Result Date: 03/19/2023 CLINICAL DATA:  Evaluate pneumothorax EXAM: PORTABLE CHEST 1 VIEW COMPARISON:  03/18/2023; 03/14/2023; chest CT 03/14/2023 FINDINGS: Unchanged tiny left apical pneumothorax. Unchanged borderline enlarged cardiac silhouette and mediastinal contours with atherosclerotic plaque in the thoracic aorta. Pulmonary vasculature remains indistinct with cephalization of flow. Unchanged bibasilar heterogeneous opacities. Unchanged left lateral chest wall subcutaneous emphysema. Redemonstrated minimally displaced left lateral rib fractures. IMPRESSION: Stable examination with unchanged tiny left apical pneumothorax. Electronically Signed   By: Simonne Come M.D.   On: 03/19/2023 08:37   DG CHEST PORT 1 VIEW  Result Date: 03/18/2023 CLINICAL DATA:  Evaluate pneumothorax EXAM: PORTABLE CHEST 1 VIEW COMPARISON:  03/17/2023 FINDINGS: Small pneumothorax overlying the left upper lobe is again noted. This measures approximately 1.7 cm over the left apex compared with 2.9 cm previously. Heart size appears normal. Aortic atherosclerosis. Bilateral peripheral increased septal markings are identified compatible with pulmonary edema. Improved appearance of left lower lung atelectasis. IMPRESSION: 1. Slight decrease in size of small left apical pneumothorax. 2. Improved appearance of left lower lung atelectasis. 3. Persistent pulmonary edema. Electronically Signed   By: Signa Kell M.D.   On: 03/18/2023 09:08   DG Chest Port 1 View  Result Date: 03/17/2023 CLINICAL DATA:  141880 SOB (shortness of breath) 141880 EXAM: PORTABLE CHEST - 1 VIEW COMPARISON:  03/16/2023 FINDINGS: Patchy interstitial  and airspace opacities in both lung bases have increased since previous. Slight apparent increase in the left apical pneumothorax, lung apex projecting at the posterior aspect third rib. Heart size and mediastinal contours are within normal limits. Aortic Atherosclerosis (ICD10-170.0). No effusion. Visualized bones unremarkable. IMPRESSION: 1. Slight increase in left apical pneumothorax. 2. Increasing bibasilar infiltrates. Electronically Signed   By: Corlis Leak M.D.   On: 03/17/2023 10:16   ECHO TEE  Result Date: 03/16/2023    TRANSESOPHOGEAL ECHO REPORT   Patient Name:   IOKEPA LISCIO [facility] Date of Exam: 03/16/2023 Medical Rec #:  [id number]        Height:       71.0 in Accession #:    [id number]       Weight:       137.0 lb Date of Birth:  01-19-1961  PATIENT DETAILS Name: Kristopher [name] Age: 62 y.o. Sex: male Date of Birth: 1961/01/17 MRN: [id number]. Admitting Physician: John Giovanni, MD ION:[id number], No Pcp Per  Admit Date: 03/14/2023 Discharge date: 03/19/2023  Recommendations for Outpatient Follow-up:  Follow up with PCP in 1-2 weeks Please obtain CMP/CBC in one week Please ensure follow-up with cardiology and trauma clinic  Admitted From:  Home  Disposition: Home   Discharge Condition: good  CODE STATUS:   Code Status: Full Code   Diet recommendation:  Diet Order             Diet Heart Room service appropriate? Yes; Fluid consistency: Thin  Diet effective now           Diet - low sodium heart healthy                    Brief Summary: Patient is a 63 y.o.  male with?  Prior history of PAF-tobacco use-who fell from a ladder-he was found to have 7-9th left rib fracture-A-fib with RVR and subsequently admitted to the hospitalist service   Significant events: 9/11>> admit to [facility] 9/12>> transferred to [facility] for trauma service evaluation 9/13>> cardioversion with restoration of sinus rhythm 9/15>> back in A-fib RVR-amiodarone infusion started   Significant studies: 9/11>> x-ray left hip: Negative 9/11>> CXR: Left 7th-8th rib fracture-small left apical pneumothorax 9/11>> CT chest: Mildly displaced left seventh, eighth and ninth rib fracture, small left pneumothorax. 9/12>> echo: EF 55-60% 9/12>> TSH: 1.4   Significant microbiology data: None   Procedures: 9/13>> TEE/cardioversion   Consults: Trauma service Cards  Brief Hospital Course: Left 7th through 9th rib fractures Small apical left-sided pneumothorax Lung contusions Following a fall from a ladder-6 feet off the ground Daily CXR stable No Further recommendations from trauma service-they will arrange for outpatient follow-up and repeat chest x-ray Continue empiric Augmentin x 5 days-although suspect this is mostly lung  contusion and unlikely to be pneumonia.     Acute hypoxic respiratory failure Remains on room air Pulmonary toileting Augmentin x 5 days total   PAF with RVR S/p TEE/cardioversion on 9/13 with restoration of sinus rhythm-unfortunately-back in A-fib RVR on 9/15-subsequently started on amiodarone infusion.  Cardioversion reattempted on 9/16-unfortunately unsuccessful Discussed with Dr. Blythe Stanford to discharge on oral amiodarone 200 mg twice daily x 7 days then switch to 200 mg daily, Cardizem CD1 80 mg daily, metoprolol 50 mg twice daily and Eliquis.   Dr. Verl Dicker office will arrange for follow-up    Macrocytosis B12/folic acid levels stable   Mildly prolonged QTc interval Resolved as of EKG on 9/13   BMI: Estimated body mass index is 19.11 kg/m as calculated from the following:   Height as of this encounter: 5\' 11"  (1.803 m).   Weight as of this encounter: 62.1 kg.    Discharge Diagnoses:  Principal Problem:   Pneumothorax, left Active Problems:   Closed fracture of three ribs, left   Atrial fibrillation with rapid ventricular response (HCC)   Macrocytosis without anemia   QT prolongation   Leukocytosis   Nonrheumatic mitral valve regurgitation   Atherosclerosis of aorta (HCC)   Status post fall   Continuous dependence on cigarette smoking   Persistent atrial fibrillation [facility])   Discharge Instructions:  Activity:  As tolerated   Discharge Instructions     Call MD for:  difficulty breathing, headache or visual disturbances   Complete by: As directed    Call MD for:  extreme fatigue  LOPRESSOR Take 1 tablet (50 mg total) by mouth 2 (two) times daily.   nicotine polacrilex 4 MG gum Commonly known as: NICORETTE Take 4 mg by mouth as needed for smoking cessation.        Follow-up Information     Yates Decamp, MD Follow up in 4 week(s).   Specialty: Cardiology Why: Call to schedule appointment Contact information: [address] [place] [address] [phone]         [facility]. Schedule an appointment as soon as possible for a visit in 1 week(s).   Why: For follow up chest x-ray. If they have any trouble finding order, please have them call [facility] [place] [facility] at [phone] Contact information: [address] [place] [address]        [facility] Follow up.   Why: No follow up appointment scheduled but we will  call with results of outpatient chest x-ray Contact information: [address] [place] [address] [phone]               No Known Allergies   Other Procedures/Studies: EP STUDY  Result Date: 03/19/2023 See surgical note for result.  DG CHEST PORT 1 VIEW  Result Date: 03/19/2023 CLINICAL DATA:  Evaluate pneumothorax EXAM: PORTABLE CHEST 1 VIEW COMPARISON:  03/18/2023; 03/14/2023; chest CT 03/14/2023 FINDINGS: Unchanged tiny left apical pneumothorax. Unchanged borderline enlarged cardiac silhouette and mediastinal contours with atherosclerotic plaque in the thoracic aorta. Pulmonary vasculature remains indistinct with cephalization of flow. Unchanged bibasilar heterogeneous opacities. Unchanged left lateral chest wall subcutaneous emphysema. Redemonstrated minimally displaced left lateral rib fractures. IMPRESSION: Stable examination with unchanged tiny left apical pneumothorax. Electronically Signed   By: Simonne Come M.D.   On: 03/19/2023 08:37   DG CHEST PORT 1 VIEW  Result Date: 03/18/2023 CLINICAL DATA:  Evaluate pneumothorax EXAM: PORTABLE CHEST 1 VIEW COMPARISON:  03/17/2023 FINDINGS: Small pneumothorax overlying the left upper lobe is again noted. This measures approximately 1.7 cm over the left apex compared with 2.9 cm previously. Heart size appears normal. Aortic atherosclerosis. Bilateral peripheral increased septal markings are identified compatible with pulmonary edema. Improved appearance of left lower lung atelectasis. IMPRESSION: 1. Slight decrease in size of small left apical pneumothorax. 2. Improved appearance of left lower lung atelectasis. 3. Persistent pulmonary edema. Electronically Signed   By: Signa Kell M.D.   On: 03/18/2023 09:08   DG Chest Port 1 View  Result Date: 03/17/2023 CLINICAL DATA:  141880 SOB (shortness of breath) 141880 EXAM: PORTABLE CHEST - 1 VIEW COMPARISON:  03/16/2023 FINDINGS: Patchy interstitial  and airspace opacities in both lung bases have increased since previous. Slight apparent increase in the left apical pneumothorax, lung apex projecting at the posterior aspect third rib. Heart size and mediastinal contours are within normal limits. Aortic Atherosclerosis (ICD10-170.0). No effusion. Visualized bones unremarkable. IMPRESSION: 1. Slight increase in left apical pneumothorax. 2. Increasing bibasilar infiltrates. Electronically Signed   By: Corlis Leak M.D.   On: 03/17/2023 10:16   ECHO TEE  Result Date: 03/16/2023    TRANSESOPHOGEAL ECHO REPORT   Patient Name:   IOKEPA LISCIO [facility] Date of Exam: 03/16/2023 Medical Rec #:  [id number]        Height:       71.0 in Accession #:    [id number]       Weight:       137.0 lb Date of Birth:  01-19-1961  LOPRESSOR Take 1 tablet (50 mg total) by mouth 2 (two) times daily.   nicotine polacrilex 4 MG gum Commonly known as: NICORETTE Take 4 mg by mouth as needed for smoking cessation.        Follow-up Information     Yates Decamp, MD Follow up in 4 week(s).   Specialty: Cardiology Why: Call to schedule appointment Contact information: [address] [place] [address] [phone]         [facility]. Schedule an appointment as soon as possible for a visit in 1 week(s).   Why: For follow up chest x-ray. If they have any trouble finding order, please have them call [facility] [place] [facility] at [phone] Contact information: [address] [place] [address]        [facility] Follow up.   Why: No follow up appointment scheduled but we will  call with results of outpatient chest x-ray Contact information: [address] [place] [address] [phone]               No Known Allergies   Other Procedures/Studies: EP STUDY  Result Date: 03/19/2023 See surgical note for result.  DG CHEST PORT 1 VIEW  Result Date: 03/19/2023 CLINICAL DATA:  Evaluate pneumothorax EXAM: PORTABLE CHEST 1 VIEW COMPARISON:  03/18/2023; 03/14/2023; chest CT 03/14/2023 FINDINGS: Unchanged tiny left apical pneumothorax. Unchanged borderline enlarged cardiac silhouette and mediastinal contours with atherosclerotic plaque in the thoracic aorta. Pulmonary vasculature remains indistinct with cephalization of flow. Unchanged bibasilar heterogeneous opacities. Unchanged left lateral chest wall subcutaneous emphysema. Redemonstrated minimally displaced left lateral rib fractures. IMPRESSION: Stable examination with unchanged tiny left apical pneumothorax. Electronically Signed   By: Simonne Come M.D.   On: 03/19/2023 08:37   DG CHEST PORT 1 VIEW  Result Date: 03/18/2023 CLINICAL DATA:  Evaluate pneumothorax EXAM: PORTABLE CHEST 1 VIEW COMPARISON:  03/17/2023 FINDINGS: Small pneumothorax overlying the left upper lobe is again noted. This measures approximately 1.7 cm over the left apex compared with 2.9 cm previously. Heart size appears normal. Aortic atherosclerosis. Bilateral peripheral increased septal markings are identified compatible with pulmonary edema. Improved appearance of left lower lung atelectasis. IMPRESSION: 1. Slight decrease in size of small left apical pneumothorax. 2. Improved appearance of left lower lung atelectasis. 3. Persistent pulmonary edema. Electronically Signed   By: Signa Kell M.D.   On: 03/18/2023 09:08   DG Chest Port 1 View  Result Date: 03/17/2023 CLINICAL DATA:  141880 SOB (shortness of breath) 141880 EXAM: PORTABLE CHEST - 1 VIEW COMPARISON:  03/16/2023 FINDINGS: Patchy interstitial  and airspace opacities in both lung bases have increased since previous. Slight apparent increase in the left apical pneumothorax, lung apex projecting at the posterior aspect third rib. Heart size and mediastinal contours are within normal limits. Aortic Atherosclerosis (ICD10-170.0). No effusion. Visualized bones unremarkable. IMPRESSION: 1. Slight increase in left apical pneumothorax. 2. Increasing bibasilar infiltrates. Electronically Signed   By: Corlis Leak M.D.   On: 03/17/2023 10:16   ECHO TEE  Result Date: 03/16/2023    TRANSESOPHOGEAL ECHO REPORT   Patient Name:   IOKEPA LISCIO [facility] Date of Exam: 03/16/2023 Medical Rec #:  [id number]        Height:       71.0 in Accession #:    [id number]       Weight:       137.0 lb Date of Birth:  01-19-1961  LOPRESSOR Take 1 tablet (50 mg total) by mouth 2 (two) times daily.   nicotine polacrilex 4 MG gum Commonly known as: NICORETTE Take 4 mg by mouth as needed for smoking cessation.        Follow-up Information     Yates Decamp, MD Follow up in 4 week(s).   Specialty: Cardiology Why: Call to schedule appointment Contact information: [address] [place] [address] [phone]         [facility]. Schedule an appointment as soon as possible for a visit in 1 week(s).   Why: For follow up chest x-ray. If they have any trouble finding order, please have them call [facility] [place] [facility] at [phone] Contact information: [address] [place] [address]        [facility] Follow up.   Why: No follow up appointment scheduled but we will  call with results of outpatient chest x-ray Contact information: [address] [place] [address] [phone]               No Known Allergies   Other Procedures/Studies: EP STUDY  Result Date: 03/19/2023 See surgical note for result.  DG CHEST PORT 1 VIEW  Result Date: 03/19/2023 CLINICAL DATA:  Evaluate pneumothorax EXAM: PORTABLE CHEST 1 VIEW COMPARISON:  03/18/2023; 03/14/2023; chest CT 03/14/2023 FINDINGS: Unchanged tiny left apical pneumothorax. Unchanged borderline enlarged cardiac silhouette and mediastinal contours with atherosclerotic plaque in the thoracic aorta. Pulmonary vasculature remains indistinct with cephalization of flow. Unchanged bibasilar heterogeneous opacities. Unchanged left lateral chest wall subcutaneous emphysema. Redemonstrated minimally displaced left lateral rib fractures. IMPRESSION: Stable examination with unchanged tiny left apical pneumothorax. Electronically Signed   By: Simonne Come M.D.   On: 03/19/2023 08:37   DG CHEST PORT 1 VIEW  Result Date: 03/18/2023 CLINICAL DATA:  Evaluate pneumothorax EXAM: PORTABLE CHEST 1 VIEW COMPARISON:  03/17/2023 FINDINGS: Small pneumothorax overlying the left upper lobe is again noted. This measures approximately 1.7 cm over the left apex compared with 2.9 cm previously. Heart size appears normal. Aortic atherosclerosis. Bilateral peripheral increased septal markings are identified compatible with pulmonary edema. Improved appearance of left lower lung atelectasis. IMPRESSION: 1. Slight decrease in size of small left apical pneumothorax. 2. Improved appearance of left lower lung atelectasis. 3. Persistent pulmonary edema. Electronically Signed   By: Signa Kell M.D.   On: 03/18/2023 09:08   DG Chest Port 1 View  Result Date: 03/17/2023 CLINICAL DATA:  141880 SOB (shortness of breath) 141880 EXAM: PORTABLE CHEST - 1 VIEW COMPARISON:  03/16/2023 FINDINGS: Patchy interstitial  and airspace opacities in both lung bases have increased since previous. Slight apparent increase in the left apical pneumothorax, lung apex projecting at the posterior aspect third rib. Heart size and mediastinal contours are within normal limits. Aortic Atherosclerosis (ICD10-170.0). No effusion. Visualized bones unremarkable. IMPRESSION: 1. Slight increase in left apical pneumothorax. 2. Increasing bibasilar infiltrates. Electronically Signed   By: Corlis Leak M.D.   On: 03/17/2023 10:16   ECHO TEE  Result Date: 03/16/2023    TRANSESOPHOGEAL ECHO REPORT   Patient Name:   IOKEPA LISCIO [facility] Date of Exam: 03/16/2023 Medical Rec #:  [id number]        Height:       71.0 in Accession #:    [id number]       Weight:       137.0 lb Date of Birth:  01-19-1961  LOPRESSOR Take 1 tablet (50 mg total) by mouth 2 (two) times daily.   nicotine polacrilex 4 MG gum Commonly known as: NICORETTE Take 4 mg by mouth as needed for smoking cessation.        Follow-up Information     Yates Decamp, MD Follow up in 4 week(s).   Specialty: Cardiology Why: Call to schedule appointment Contact information: [address] [place] [address] [phone]         [facility]. Schedule an appointment as soon as possible for a visit in 1 week(s).   Why: For follow up chest x-ray. If they have any trouble finding order, please have them call [facility] at [phone] Contact information: [address] [place] [address]        [facility] Follow up.   Why: No follow up appointment scheduled but we will  call with results of outpatient chest x-ray Contact information: [address] [place] [address] [phone]               No Known Allergies   Other Procedures/Studies: EP STUDY  Result Date: 03/19/2023 See surgical note for result.  DG CHEST PORT 1 VIEW  Result Date: 03/19/2023 CLINICAL DATA:  Evaluate pneumothorax EXAM: PORTABLE CHEST 1 VIEW COMPARISON:  03/18/2023; 03/14/2023; chest CT 03/14/2023 FINDINGS: Unchanged tiny left apical pneumothorax. Unchanged borderline enlarged cardiac silhouette and mediastinal contours with atherosclerotic plaque in the thoracic aorta. Pulmonary vasculature remains indistinct with cephalization of flow. Unchanged bibasilar heterogeneous opacities. Unchanged left lateral chest wall subcutaneous emphysema. Redemonstrated minimally displaced left lateral rib fractures. IMPRESSION: Stable examination with unchanged tiny left apical pneumothorax. Electronically Signed   By: Simonne Come M.D.   On: 03/19/2023 08:37   DG CHEST PORT 1 VIEW  Result Date: 03/18/2023 CLINICAL DATA:  Evaluate pneumothorax EXAM: PORTABLE CHEST 1 VIEW COMPARISON:  03/17/2023 FINDINGS: Small pneumothorax overlying the left upper lobe is again noted. This measures approximately 1.7 cm over the left apex compared with 2.9 cm previously. Heart size appears normal. Aortic atherosclerosis. Bilateral peripheral increased septal markings are identified compatible with pulmonary edema. Improved appearance of left lower lung atelectasis. IMPRESSION: 1. Slight decrease in size of small left apical pneumothorax. 2. Improved appearance of left lower lung atelectasis. 3. Persistent pulmonary edema. Electronically Signed   By: Signa Kell M.D.   On: 03/18/2023 09:08   DG Chest Port 1 View  Result Date: 03/17/2023 CLINICAL DATA:  141880 SOB (shortness of breath) 141880 EXAM: PORTABLE CHEST - 1 VIEW COMPARISON:  03/16/2023 FINDINGS: Patchy interstitial  and airspace opacities in both lung bases have increased since previous. Slight apparent increase in the left apical pneumothorax, lung apex projecting at the posterior aspect third rib. Heart size and mediastinal contours are within normal limits. Aortic Atherosclerosis (ICD10-170.0). No effusion. Visualized bones unremarkable. IMPRESSION: 1. Slight increase in left apical pneumothorax. 2. Increasing bibasilar infiltrates. Electronically Signed   By: Corlis Leak M.D.   On: 03/17/2023 10:16   ECHO TEE  Result Date: 03/16/2023    TRANSESOPHOGEAL ECHO REPORT   Patient Name:   IOKEPA LISCIO [facility] Date of Exam: 03/16/2023 Medical Rec #:  [id number]        Height:       71.0 in Accession #:    [id number]       Weight:       137.0 lb Date of Birth:  01-19-1961  LOPRESSOR Take 1 tablet (50 mg total) by mouth 2 (two) times daily.   nicotine polacrilex 4 MG gum Commonly known as: NICORETTE Take 4 mg by mouth as needed for smoking cessation.        Follow-up Information     Yates Decamp, MD Follow up in 4 week(s).   Specialty: Cardiology Why: Call to schedule appointment Contact information: [address] [place] [address] [phone]         [facility]. Schedule an appointment as soon as possible for a visit in 1 week(s).   Why: For follow up chest x-ray. If they have any trouble finding order, please have them call [facility] at [phone] Contact information: [address] [place] [address]        [facility] Follow up.   Why: No follow up appointment scheduled but we will  call with results of outpatient chest x-ray Contact information: [address] [place] [address] [phone]               No Known Allergies   Other Procedures/Studies: EP STUDY  Result Date: 03/19/2023 See surgical note for result.  DG CHEST PORT 1 VIEW  Result Date: 03/19/2023 CLINICAL DATA:  Evaluate pneumothorax EXAM: PORTABLE CHEST 1 VIEW COMPARISON:  03/18/2023; 03/14/2023; chest CT 03/14/2023 FINDINGS: Unchanged tiny left apical pneumothorax. Unchanged borderline enlarged cardiac silhouette and mediastinal contours with atherosclerotic plaque in the thoracic aorta. Pulmonary vasculature remains indistinct with cephalization of flow. Unchanged bibasilar heterogeneous opacities. Unchanged left lateral chest wall subcutaneous emphysema. Redemonstrated minimally displaced left lateral rib fractures. IMPRESSION: Stable examination with unchanged tiny left apical pneumothorax. Electronically Signed   By: Simonne Come M.D.   On: 03/19/2023 08:37   DG CHEST PORT 1 VIEW  Result Date: 03/18/2023 CLINICAL DATA:  Evaluate pneumothorax EXAM: PORTABLE CHEST 1 VIEW COMPARISON:  03/17/2023 FINDINGS: Small pneumothorax overlying the left upper lobe is again noted. This measures approximately 1.7 cm over the left apex compared with 2.9 cm previously. Heart size appears normal. Aortic atherosclerosis. Bilateral peripheral increased septal markings are identified compatible with pulmonary edema. Improved appearance of left lower lung atelectasis. IMPRESSION: 1. Slight decrease in size of small left apical pneumothorax. 2. Improved appearance of left lower lung atelectasis. 3. Persistent pulmonary edema. Electronically Signed   By: Signa Kell M.D.   On: 03/18/2023 09:08   DG Chest Port 1 View  Result Date: 03/17/2023 CLINICAL DATA:  141880 SOB (shortness of breath) 141880 EXAM: PORTABLE CHEST - 1 VIEW COMPARISON:  03/16/2023 FINDINGS: Patchy interstitial  and airspace opacities in both lung bases have increased since previous. Slight apparent increase in the left apical pneumothorax, lung apex projecting at the posterior aspect third rib. Heart size and mediastinal contours are within normal limits. Aortic Atherosclerosis (ICD10-170.0). No effusion. Visualized bones unremarkable. IMPRESSION: 1. Slight increase in left apical pneumothorax. 2. Increasing bibasilar infiltrates. Electronically Signed   By: Corlis Leak M.D.   On: 03/17/2023 10:16   ECHO TEE  Result Date: 03/16/2023    TRANSESOPHOGEAL ECHO REPORT   Patient Name:   IOKEPA LISCIO [facility] Date of Exam: 03/16/2023 Medical Rec #:  [id number]        Height:       71.0 in Accession #:    [id number]       Weight:       137.0 lb Date of Birth:  01-19-1961  LOPRESSOR Take 1 tablet (50 mg total) by mouth 2 (two) times daily.   nicotine polacrilex 4 MG gum Commonly known as: NICORETTE Take 4 mg by mouth as needed for smoking cessation.        Follow-up Information     Yates Decamp, MD Follow up in 4 week(s).   Specialty: Cardiology Why: Call to schedule appointment Contact information: [address] [place] [address] [phone]         [facility]. Schedule an appointment as soon as possible for a visit in 1 week(s).   Why: For follow up chest x-ray. If they have any trouble finding order, please have them call [facility] [place] [facility] at [phone] Contact information: [address] [place] [address]        [facility] Follow up.   Why: No follow up appointment scheduled but we will  call with results of outpatient chest x-ray Contact information: [address] [place] [address] [phone]               No Known Allergies   Other Procedures/Studies: EP STUDY  Result Date: 03/19/2023 See surgical note for result.  DG CHEST PORT 1 VIEW  Result Date: 03/19/2023 CLINICAL DATA:  Evaluate pneumothorax EXAM: PORTABLE CHEST 1 VIEW COMPARISON:  03/18/2023; 03/14/2023; chest CT 03/14/2023 FINDINGS: Unchanged tiny left apical pneumothorax. Unchanged borderline enlarged cardiac silhouette and mediastinal contours with atherosclerotic plaque in the thoracic aorta. Pulmonary vasculature remains indistinct with cephalization of flow. Unchanged bibasilar heterogeneous opacities. Unchanged left lateral chest wall subcutaneous emphysema. Redemonstrated minimally displaced left lateral rib fractures. IMPRESSION: Stable examination with unchanged tiny left apical pneumothorax. Electronically Signed   By: Simonne Come M.D.   On: 03/19/2023 08:37   DG CHEST PORT 1 VIEW  Result Date: 03/18/2023 CLINICAL DATA:  Evaluate pneumothorax EXAM: PORTABLE CHEST 1 VIEW COMPARISON:  03/17/2023 FINDINGS: Small pneumothorax overlying the left upper lobe is again noted. This measures approximately 1.7 cm over the left apex compared with 2.9 cm previously. Heart size appears normal. Aortic atherosclerosis. Bilateral peripheral increased septal markings are identified compatible with pulmonary edema. Improved appearance of left lower lung atelectasis. IMPRESSION: 1. Slight decrease in size of small left apical pneumothorax. 2. Improved appearance of left lower lung atelectasis. 3. Persistent pulmonary edema. Electronically Signed   By: Signa Kell M.D.   On: 03/18/2023 09:08   DG Chest Port 1 View  Result Date: 03/17/2023 CLINICAL DATA:  141880 SOB (shortness of breath) 141880 EXAM: PORTABLE CHEST - 1 VIEW COMPARISON:  03/16/2023 FINDINGS: Patchy interstitial  and airspace opacities in both lung bases have increased since previous. Slight apparent increase in the left apical pneumothorax, lung apex projecting at the posterior aspect third rib. Heart size and mediastinal contours are within normal limits. Aortic Atherosclerosis (ICD10-170.0). No effusion. Visualized bones unremarkable. IMPRESSION: 1. Slight increase in left apical pneumothorax. 2. Increasing bibasilar infiltrates. Electronically Signed   By: Corlis Leak M.D.   On: 03/17/2023 10:16   ECHO TEE  Result Date: 03/16/2023    TRANSESOPHOGEAL ECHO REPORT   Patient Name:   IOKEPA LISCIO [facility] Date of Exam: 03/16/2023 Medical Rec #:  [id number]        Height:       71.0 in Accession #:    [id number]       Weight:       137.0 lb Date of Birth:  01-19-1961

## 2023-03-19 NOTE — TOC Benefit Eligibility Note (Signed)
Patient Product/process development scientist completed.    The patient is insured through [employer]. Patient has [employer], may use a copay card, and/or apply for patient assistance if available.    Ran test claim for Eliquis 5 mg and the current 30 day co-pay is $567.26 due to a $7500 deductible.   This test claim was processed through [facility]- copay amounts may vary at other pharmacies due to pharmacy/plan contracts, or as the patient moves through the different stages of their insurance plan.     Roland Earl, CPHT Pharmacy Technician III Certified Patient Advocate [facility] Patient Advocate Team Direct Number: [phone]  Fax: [phone]

## 2023-03-19 NOTE — Anesthesia Preprocedure Evaluation (Signed)
Anesthesia Evaluation  Patient identified by MRN, date of birth, ID band Patient awake    Reviewed: Allergy & Precautions, H&P , NPO status , Patient's Chart, lab work & pertinent test results  Airway Mallampati: II  TM Distance: >3 FB Neck ROM: Full    Dental no notable dental hx.    Pulmonary Current Smoker   Pulmonary exam normal breath sounds clear to auscultation       Cardiovascular + dysrhythmias Atrial Fibrillation + Valvular Problems/Murmurs MR  Rhythm:Regular Rate:Normal + Systolic murmurs 1. Left ventricular ejection fraction, by estimation, is 60 to 65%. The  left ventricle has normal function. The left ventricle has no regional  wall motion abnormalities.   2. Right ventricular systolic function is normal. The right ventricular  size is normal.   3. No left atrial/left atrial appendage thrombus was detected. The LAA  emptying velocity was 26 cm/s.   4. The mitral valve is normal in structure. Mild mitral valve  regurgitation. No evidence of mitral stenosis.   5. The aortic valve is normal in structure. Aortic valve regurgitation is  not visualized. No aortic stenosis is present.   6. The inferior vena cava is normal in size with greater than 50%  respiratory variability, suggesting right atrial pressure of 3 mmHg.   Conclusion(s)/Recommendation(s): Normal biventricular function without  evidence of hemodynamically significant valvular heart disease.     Neuro/Psych negative neurological ROS  negative psych ROS   GI/Hepatic negative GI ROS, Neg liver ROS,,,  Endo/Other  negative endocrine ROS    Renal/GU negative Renal ROS  negative genitourinary   Musculoskeletal negative musculoskeletal ROS (+)    Abdominal   Peds negative pediatric ROS (+)  Hematology negative hematology ROS (+)   Anesthesia Other Findings   Reproductive/Obstetrics negative OB ROS                              Anesthesia Physical Anesthesia Plan  ASA: 3  Anesthesia Plan: General   Post-op Pain Management: Minimal or no pain anticipated   Induction: Intravenous  PONV Risk Score and Plan: 1 and Treatment may vary due to age or medical condition  Airway Management Planned: Mask and Nasal Cannula  Additional Equipment:   Intra-op Plan:   Post-operative Plan:   Informed Consent: I have reviewed the patients History and Physical, chart, labs and discussed the procedure including the risks, benefits and alternatives for the proposed anesthesia with the patient or authorized representative who has indicated his/her understanding and acceptance.     Dental advisory given  Plan Discussed with: CRNA and Surgeon  Anesthesia Plan Comments:        Anesthesia Quick Evaluation

## 2023-03-19 NOTE — Transfer of Care (Signed)
Immediate Anesthesia Transfer of Care Note  Patient: Kristopher [name]  Procedure(s) Performed: CARDIOVERSION  Patient Location: Cath Lab  Anesthesia Type:General  Level of Consciousness: awake, alert , oriented, patient cooperative, and responds to stimulation  Airway & Oxygen Therapy: Patient Spontanous Breathing and Patient connected to nasal cannula oxygen  Post-op Assessment: Report given to RN and Post -op Vital signs reviewed and stable  Post vital signs: Reviewed and stable  Last Vitals:  Vitals Value Taken Time  BP 121/88 03/19/23 1308  Temp    Pulse 97 03/19/23 1309  Resp 17 03/19/23 1309  SpO2 97 % 03/19/23 1309  Vitals shown include unfiled device data.  Last Pain:  Vitals:   03/19/23 1220  TempSrc: Temporal  PainSc:          Complications: No notable events documented.

## 2023-03-19 NOTE — Progress Notes (Signed)
ANTICOAGULATION CONSULT NOTE- Follow Up  Pharmacy Consult for Heparin>>apixaban Indication: atrial fibrillation  No Known Allergies  Patient Measurements: Height: 5\' 11"  (180.3 cm) Weight: 62.1 kg (137 lb) IBW/kg (Calculated) : 75.3 Heparin Dosing Weight: 62 kg  Vital Signs: Temp: 98.5 F (36.9 C) (09/16 0800) Temp Source: Oral (09/16 0800) BP: 133/65 (09/16 0800) Pulse Rate: 89 (09/16 0800)  Labs: Recent Labs    03/16/23 1246 03/16/23 1834 03/17/23 0518 03/17/23 1248 03/17/23 2310 03/18/23 0830 03/19/23 0355  HGB 14.7  --  14.2  --   --  14.3 14.1  HCT 44.5  --  42.3  --   --  42.7 41.5  PLT 248  --  233  --   --  260 251  HEPARINUNFRC  --    < > 0.18*   < > 0.30 0.38 0.14*  CREATININE 0.75  --  0.75  --   --   --  0.90   < > = values in this interval not displayed.    Estimated Creatinine Clearance: 73.8 mL/min (by C-G formula based on SCr of 0.9 mg/dL).  Assessment: 62 y.o. male with no PMH who is admitted presented after a fall from a ladder with with left 7 through 9 rib fractures and small left pneumothorax, found to be in A-fib with RVR. Pharmacy consulted to manage heparin.  Plan to change to apixaban today. CBC stable  Goal of Therapy:  Monitor platelets by anticoagulation protocol: Yes   Plan:  Dc heparin Apixaban 5mg  PO BID Rx will follow peripherally  Ulyses Southward, PharmD, BCIDP, AAHIVP, CPP Infectious Disease Pharmacist 03/19/2023 10:47 AM

## 2023-03-19 NOTE — Progress Notes (Signed)
PROGRESS NOTE        PATIENT DETAILS Name: Kristopher [name] Age: 62 y.o. Sex: male Date of Birth: 05-Feb-1961 Admit Date: 03/14/2023 Admitting Physician John Giovanni, MD NWG:[id number], No Pcp Per  Brief Summary: Patient is a 62 y.o.  male with?  Prior history of PAF-tobacco use-who fell from a ladder-he was found to have 7-9th left rib fracture-A-fib with RVR and subsequently admitted to the hospitalist service  Significant events: 9/11>> admit to [facility] at [facility] 9/12>> transferred to [facility] for trauma service evaluation 9/13>> cardioversion with restoration of sinus rhythm 9/15>> back in A-fib RVR-amiodarone infusion started  Significant studies: 9/11>> x-ray left hip: Negative 9/11>> CXR: Left 7th-8th rib fracture-small left apical pneumothorax 9/11>> CT chest: Mildly displaced left seventh, eighth and ninth rib fracture, small left pneumothorax. 9/12>> echo: EF 55-60% 9/12>> TSH: 1.4  Significant microbiology data: None  Procedures: 9/13>> TEE/cardioversion  Consults: Trauma service Cards  Subjective: Remains in A-fib-rate controlled.  No complaints.  On room air.  Objective: Vitals: Blood pressure 133/65, pulse 89, temperature 98.5 F (36.9 C), temperature source Oral, resp. rate 16, height 5\' 11"  (1.803 m), weight 62.1 kg, SpO2 92%.   Exam: Gen Exam:Alert awake-not in any distress HEENT:atraumatic, normocephalic Chest: B/L clear to auscultation anteriorly CVS:S1S2 regular Abdomen:soft non tender, non distended Extremities:no edema Neurology: Non focal Skin: no rash  Pertinent Labs/Radiology:    Latest Ref Rng & Units 03/19/2023    3:55 AM 03/18/2023    8:30 AM 03/17/2023    5:18 AM  CBC  WBC 4.0 - 10.5 K/uL 11.7  15.1  13.8   Hemoglobin 13.0 - 17.0 g/dL 86.5  78.4  69.6   Hematocrit 39.0 - 52.0 % 41.5  42.7  42.3   Platelets 150 - 400 K/uL 251  260  233     Lab Results  Component Value Date   NA 138 03/19/2023   K 3.9  03/19/2023   CL 102 03/19/2023   CO2 26 03/19/2023      Assessment/Plan: Left 7th through 9th rib fractures Small apical left-sided pneumothorax Lung contusions Following a fall from a ladder-6 feet off the ground Daily CXR stable Further recommendations from trauma service-they will arrange for outpatient follow-up and repeat chest x-ray Continue pulmonary tolerating Continue empiric Augmentin x 5 days-although suspect this is mostly lung contusion and unlikely to be pneumonia.    Acute hypoxic respiratory failure Remains on room air Pulmonary toileting Augmentin x 5 days total  PAF with RVR S/p TEE/cardioversion on 9/13 with restoration of sinus rhythm-unfortunately-back in A-fib RVR on 9/15 Remains in A-fib but rate controlled with oral Cardizem and amiodarone IV heparin-discussed with trauma service-okay to switch to Eliquis today Cardiology  following-will await further recommendations.  Macrocytosis B12/folic acid levels stable  Mildly prolonged QTc interval Resolved as of EKG on 9/13  BMI: Estimated body mass index is 19.11 kg/m as calculated from the following:   Height as of this encounter: 5\' 11"  (1.803 m).   Weight as of this encounter: 62.1 kg.   Code status:   Code Status: Full Code   DVT Prophylaxis: SCDs Start: 03/15/23 0005   Family Communication: Spouse at bedside   Disposition Plan: Status is: Inpatient Remains inpatient appropriate because: Severity of illness   Planned Discharge Destination:Home   Diet: Diet Order  Diet NPO time specified  Diet effective now                     Antimicrobial agents: Anti-infectives (From admission, onward)    Start     Dose/Rate Route Frequency Ordered Stop   03/17/23 1145  amoxicillin-clavulanate (AUGMENTIN) 875-125 MG per tablet 1 tablet        1 tablet Oral Every 12 hours 03/17/23 1048          MEDICATIONS: Scheduled Meds:  acetaminophen  1,000 mg Oral Q6H    amoxicillin-clavulanate  1 tablet Oral Q12H   diltiazem  60 mg Oral Q8H   furosemide  20 mg Intravenous Daily   gabapentin  300 mg Oral TID   guaiFENesin  600 mg Oral BID   ibuprofen  800 mg Oral TID   ipratropium-albuterol  3 mL Nebulization QID   lidocaine  1 patch Transdermal Q24H   methocarbamol  1,000 mg Oral TID   nicotine  14 mg Transdermal Daily   psyllium  1 packet Oral BID   rosuvastatin  10 mg Oral Daily   Continuous Infusions:  amiodarone 30 mg/hr (03/19/23 0756)   heparin 1,650 Units/hr (03/19/23 0800)   methocarbamol (ROBAXIN) IV     ondansetron (ZOFRAN) IV     PRN Meds:.albuterol, alum & mag hydroxide-simeth, bisacodyl, guaiFENesin-dextromethorphan, HYDROmorphone (DILAUDID) injection, magic mouthwash, menthol-cetylpyridinium, methocarbamol (ROBAXIN) IV, metoprolol tartrate, naLOXone (NARCAN)  injection, naphazoline-glycerin, ondansetron (ZOFRAN) IV **OR** ondansetron (ZOFRAN) IV, oxyCODONE, phenol, simethicone, sodium chloride   I have personally reviewed following labs and imaging studies  LABORATORY DATA: CBC: Recent Labs  Lab 03/15/23 0649 03/16/23 1246 03/17/23 0518 03/18/23 0830 03/19/23 0355  WBC 14.1* 13.4* 13.8* 15.1* 11.7*  NEUTROABS  --  10.5* 10.9* 12.4* 8.8*  HGB 15.1 14.7 14.2 14.3 14.1  HCT 45.4 44.5 42.3 42.7 41.5  MCV 106.3* 102.8* 102.9* 101.9* 104.3*  PLT 268 248 233 260 251    Basic Metabolic Panel: Recent Labs  Lab 03/14/23 1653 03/15/23 0044 03/15/23 0649 03/16/23 1246 03/17/23 0518 03/18/23 1600 03/19/23 0355  NA 136  --  133* 136 137  --  138  K 5.0  --  4.6 4.2 3.9  --  3.9  CL 100  --  102 98 99  --  102  CO2 25  --  25 27 27   --  26  GLUCOSE 109*  --  129* 87 96  --  99  BUN 15  --  15 13 13   --  11  CREATININE 0.97  --  0.69 0.75 0.75  --  0.90  CALCIUM 9.7  --  9.1 9.1 8.8*  --  8.7*  MG  --  2.1  --  1.8 1.8 1.9 1.8    GFR: Estimated Creatinine Clearance: 73.8 mL/min (by C-G formula based on SCr of 0.9  mg/dL).  Liver Function Tests: Recent Labs  Lab 03/14/23 1653  AST 31  ALT 28  ALKPHOS 83  BILITOT 1.0  PROT 7.9  ALBUMIN 4.7   No results for input(s): "LIPASE", "AMYLASE" in the last 168 hours. No results for input(s): "AMMONIA" in the last 168 hours.  Coagulation Profile: No results for input(s): "INR", "PROTIME" in the last 168 hours.  Cardiac Enzymes: No results for input(s): "CKTOTAL", "CKMB", "CKMBINDEX", "TROPONINI" in the last 168 hours.  BNP (last 3 results) No results for input(s): "PROBNP" in the last 8760 hours.  Lipid Profile: Recent Labs    03/16/23 1246  CHOL 164  HDL 90  LDLCALC 68  TRIG 32  CHOLHDL 1.8    Thyroid Function Tests: No results for input(s): "TSH", "T4TOTAL", "FREET4", "T3FREE", "THYROIDAB" in the last 72 hours.   Anemia Panel: Recent Labs    03/16/23 1246  VITAMINB12 376  FOLATE 9.6    Urine analysis: No results found for: "COLORURINE", "APPEARANCEUR", "LABSPEC", "PHURINE", "GLUCOSEU", "HGBUR", "BILIRUBINUR", "KETONESUR", "PROTEINUR", "UROBILINOGEN", "NITRITE", "LEUKOCYTESUR"  Sepsis Labs: Lactic Acid, Venous No results found for: "LATICACIDVEN"  MICROBIOLOGY: No results found for this or any previous visit (from the past 240 hour(s)).  RADIOLOGY STUDIES/RESULTS: DG CHEST PORT 1 VIEW  Result Date: 03/19/2023 CLINICAL DATA:  Evaluate pneumothorax EXAM: PORTABLE CHEST 1 VIEW COMPARISON:  03/18/2023; 03/14/2023; chest CT 03/14/2023 FINDINGS: Unchanged tiny left apical pneumothorax. Unchanged borderline enlarged cardiac silhouette and mediastinal contours with atherosclerotic plaque in the thoracic aorta. Pulmonary vasculature remains indistinct with cephalization of flow. Unchanged bibasilar heterogeneous opacities. Unchanged left lateral chest wall subcutaneous emphysema. Redemonstrated minimally displaced left lateral rib fractures. IMPRESSION: Stable examination with unchanged tiny left apical pneumothorax. Electronically  Signed   By: Simonne Come M.D.   On: 03/19/2023 08:37   DG CHEST PORT 1 VIEW  Result Date: 03/18/2023 CLINICAL DATA:  Evaluate pneumothorax EXAM: PORTABLE CHEST 1 VIEW COMPARISON:  03/17/2023 FINDINGS: Small pneumothorax overlying the left upper lobe is again noted. This measures approximately 1.7 cm over the left apex compared with 2.9 cm previously. Heart size appears normal. Aortic atherosclerosis. Bilateral peripheral increased septal markings are identified compatible with pulmonary edema. Improved appearance of left lower lung atelectasis. IMPRESSION: 1. Slight decrease in size of small left apical pneumothorax. 2. Improved appearance of left lower lung atelectasis. 3. Persistent pulmonary edema. Electronically Signed   By: Signa Kell M.D.   On: 03/18/2023 09:08     LOS: 5 days   Jeoffrey Massed, MD  [facility]    To contact the attending provider between 7A-7P or the covering provider during after hours 7P-7A, please log into the web site [email or web address] and access using universal [place] password for that web site. If you do not have the password, please call the hospital operator.  03/19/2023, 10:13 AM

## 2023-03-19 NOTE — Progress Notes (Signed)
Progress Note  3 Days Post-Op  Subjective: Pt denies SOB this AM, pulling ~1750 on IS. Reports he is quitting smoking. Pain from rib fractures well controlled. Reviewed imaging of rib fractures with patient and his wife at bedside.    Objective: Vital signs in last 24 hours: Temp:  [98 F (36.7 C)-98.8 F (37.1 C)] 98.5 F (36.9 C) (09/16 0800) Pulse Rate:  [81-100] 89 (09/16 0800) Resp:  [14-18] 16 (09/16 0800) BP: (101-133)/(62-94) 133/65 (09/16 0800) SpO2:  [88 %-95 %] 92 % (09/16 0800) Last BM Date : 03/18/23  Intake/Output from previous day: 09/15 0701 - 09/16 0700 In: 1110.7 [P.O.:480; I.V.:630.7] Out: -  Intake/Output this shift: No intake/output data recorded.  PE: General: pleasant, WD, WN male who is sitting up in NAD Heart: irregularly irregular Lungs: CTAB, no wheezes, rhonchi, or rales noted.  Respiratory effort nonlabored. Good air movement   Abd: soft, NT, ND MS: moving all 4 extremities Psych: A&Ox3 with an appropriate affect.    Lab Results:  Recent Labs    03/18/23 0830 03/19/23 0355  WBC 15.1* 11.7*  HGB 14.3 14.1  HCT 42.7 41.5  PLT 260 251   BMET Recent Labs    03/17/23 0518 03/19/23 0355  NA 137 138  K 3.9 3.9  CL 99 102  CO2 27 26  GLUCOSE 96 99  BUN 13 11  CREATININE 0.75 0.90  CALCIUM 8.8* 8.7*   PT/INR No results for input(s): "LABPROT", "INR" in the last 72 hours. CMP     Component Value Date/Time   NA 138 03/19/2023 0355   K 3.9 03/19/2023 0355   CL 102 03/19/2023 0355   CO2 26 03/19/2023 0355   GLUCOSE 99 03/19/2023 0355   BUN 11 03/19/2023 0355   CREATININE 0.90 03/19/2023 0355   CREATININE 0.69 (L) 07/29/2016 1201   CALCIUM 8.7 (L) 03/19/2023 0355   PROT 7.9 03/14/2023 1653   ALBUMIN 4.7 03/14/2023 1653   AST 31 03/14/2023 1653   ALT 28 03/14/2023 1653   ALKPHOS 83 03/14/2023 1653   BILITOT 1.0 03/14/2023 1653   GFRNONAA >60 03/19/2023 0355   GFRNONAA >89 07/29/2016 1201   GFRAA >89 07/29/2016 1201    Lipase  No results found for: "LIPASE"     Studies/Results: DG CHEST PORT 1 VIEW  Result Date: 03/19/2023 CLINICAL DATA:  Evaluate pneumothorax EXAM: PORTABLE CHEST 1 VIEW COMPARISON:  03/18/2023; 03/14/2023; chest CT 03/14/2023 FINDINGS: Unchanged tiny left apical pneumothorax. Unchanged borderline enlarged cardiac silhouette and mediastinal contours with atherosclerotic plaque in the thoracic aorta. Pulmonary vasculature remains indistinct with cephalization of flow. Unchanged bibasilar heterogeneous opacities. Unchanged left lateral chest wall subcutaneous emphysema. Redemonstrated minimally displaced left lateral rib fractures. IMPRESSION: Stable examination with unchanged tiny left apical pneumothorax. Electronically Signed   By: Simonne Come M.D.   On: 03/19/2023 08:37   DG CHEST PORT 1 VIEW  Result Date: 03/18/2023 CLINICAL DATA:  Evaluate pneumothorax EXAM: PORTABLE CHEST 1 VIEW COMPARISON:  03/17/2023 FINDINGS: Small pneumothorax overlying the left upper lobe is again noted. This measures approximately 1.7 cm over the left apex compared with 2.9 cm previously. Heart size appears normal. Aortic atherosclerosis. Bilateral peripheral increased septal markings are identified compatible with pulmonary edema. Improved appearance of left lower lung atelectasis. IMPRESSION: 1. Slight decrease in size of small left apical pneumothorax. 2. Improved appearance of left lower lung atelectasis. 3. Persistent pulmonary edema. Electronically Signed   By: Signa Kell M.D.   On: 03/18/2023 09:08  Anti-infectives: Anti-infectives (From admission, onward)    Start     Dose/Rate Route Frequency Ordered Stop   03/17/23 1145  amoxicillin-clavulanate (AUGMENTIN) 875-125 MG per tablet 1 tablet        1 tablet Oral Every 12 hours 03/17/23 1048          Assessment/Plan  Fall off ladder Rib 7-9 fxs with small PTX - CXR with slight improvement in PTX today.  - continue multimodal pain control for  rib fractures, pt reports good pain control currently  - outpatient CXR ordered in 1 week and trauma team will call with results and discuss any further recommendations   A fib with RVR - per primary, was cardioverted. Back in A. Fib but rate in the 80-90s  Stable for DC from trauma standpoint when cleared by medicine and cardiology. Please let us know if any further questions   WUJ:WJXB as tolerated from surgery standpoint.  IVF per primary  VTE: hep gtt, ok for transition to oral anticoagulation as desired per primary team.  ID: PO augmentin - Do not think he needs abx from a trauma standpoint, suspect consolidation is more contusion    LOS: 5 days   I reviewed nursing notes, Consultant cardiology notes, hospitalist notes, last 24 h vitals and pain scores, last 48 h intake and output, last 24 h labs and trends, and last 24 h imaging results.    Juliet Rude, [facility] 03/19/2023, 8:49 AM Please see Amion for pager number during day hours 7:00am-4:30pm

## 2023-03-19 NOTE — Discharge Instructions (Signed)

## 2023-03-19 NOTE — CV Procedure (Signed)
Direct current cardioversion 03/19/2023 1:08 PM  Indication symptomatic A. Fibrillation.  Procedure: Using 100 mg of IV Propofol and 60 IV Lidocaine (for reducing venous pain) for achieving deep sedation, synchronized direct current cardioversion performed. Patient was delivered with 150 x 1, then 200J Joules of electricity X 3 without success.  Patient tolerated the procedure well. No immediate complication noted.   Rec: Rate control for now with Amio 200 mg BID for 1 week then 200 mg daily upon discharge. Metoprolol Tartarate 50 mg BID Dilt CD 180 mg Daily Eliquis 5 mg BID.  Can be discharged home on this regimen, I will follow as OP. Not too concerned about elevated HR in the absence of symptoms and also CHF. Normal LVEF.    Yates Decamp, MD, [facility] 03/19/2023, 1:11 PM Office: [phone] Fax: [phone] Pager: [phone]

## 2023-03-19 NOTE — Plan of Care (Signed)
Pt has rested quietly throughout the night with no distress noted. Alert and oriented. A-fib. On room air. Up ad lib. Heparin drip and amiodarone drip infusing. No complaints voiced.     Problem: Education: Goal: Knowledge of General Education information will improve Description: Including pain rating scale, medication(s)/side effects and non-pharmacologic comfort measures Outcome: Progressing   Problem: Health Behavior/Discharge Planning: Goal: Ability to manage health-related needs will improve Outcome: Progressing   Problem: Clinical Measurements: Goal: Ability to maintain clinical measurements within normal limits will improve Outcome: Progressing Goal: Will remain free from infection Outcome: Progressing Goal: Diagnostic test results will improve Outcome: Progressing Goal: Respiratory complications will improve Outcome: Progressing Goal: Cardiovascular complication will be avoided Outcome: Progressing   Problem: Activity: Goal: Risk for activity intolerance will decrease Outcome: Progressing   Problem: Nutrition: Goal: Adequate nutrition will be maintained Outcome: Progressing   Problem: Coping: Goal: Level of anxiety will decrease Outcome: Progressing   Problem: Elimination: Goal: Will not experience complications related to bowel motility Outcome: Progressing Goal: Will not experience complications related to urinary retention Outcome: Progressing   Problem: Pain Managment: Goal: General experience of comfort will improve Outcome: Progressing   Problem: Safety: Goal: Ability to remain free from injury will improve Outcome: Progressing   Problem: Skin Integrity: Goal: Risk for impaired skin integrity will decrease Outcome: Progressing

## 2023-03-19 NOTE — Progress Notes (Signed)
ANTICOAGULATION CONSULT NOTE- Follow Up  Pharmacy Consult for Heparin Indication: atrial fibrillation  No Known Allergies  Patient Measurements: Height: 5\' 11"  (180.3 cm) Weight: 62.1 kg (137 lb) IBW/kg (Calculated) : 75.3 Heparin Dosing Weight: 62 kg  Vital Signs: Temp: 98.1 F (36.7 C) (09/16 0000) Temp Source: Oral (09/16 0000) BP: 101/69 (09/16 0000) Pulse Rate: 81 (09/16 0000)  Labs: Recent Labs    03/16/23 1246 03/16/23 1834 03/17/23 0518 03/17/23 1248 03/17/23 2310 03/18/23 0830 03/19/23 0355  HGB 14.7  --  14.2  --   --  14.3 14.1  HCT 44.5  --  42.3  --   --  42.7 41.5  PLT 248  --  233  --   --  260 251  HEPARINUNFRC  --    < > 0.18*   < > 0.30 0.38 0.14*  CREATININE 0.75  --  0.75  --   --   --  0.90   < > = values in this interval not displayed.    Estimated Creatinine Clearance: 73.8 mL/min (by C-G formula based on SCr of 0.9 mg/dL).  Assessment: 62 y.o. male with no PMH who is admitted presented after a fall from a ladder with with left 7 through 9 rib fractures and small left pneumothorax, found to be in A-fib with RVR. Pharmacy consulted to manage heparin.  Heparin level is therapeutic (0.38) on infusion at 1550 units/hr. No interruptions with infusion or issues with bleeding reported per RN. CBC stable.  9/16 AM: heparin level at 0.14 on 1550 units/hr (subtherapeutic) after 2 therapeutic reads. Per RN, no issues with the heparin infusion running continuously or signs/symptoms of bleeding. CBC stable  Goal of Therapy:  Heparin level 0.3-0.7 units/ml Monitor platelets by anticoagulation protocol: Yes   Plan:  Increase heparin infusion on 1650 units / hr 6h heparin level Daily heparin level while on heparin Monitor CBC and s/sx of bleeding F/u plans to transition to DOAC  Nicole Kindred, PharmD PGY1 Pharmacy Resident 03/19/2023 5:43 AM

## 2023-03-19 NOTE — Plan of Care (Signed)

## 2023-03-19 NOTE — Anesthesia Postprocedure Evaluation (Signed)
Anesthesia Post Note  Patient: Kristopher [name]  Procedure(s) Performed: CARDIOVERSION     Patient location during evaluation: PACU Anesthesia Type: General Level of consciousness: awake and alert Pain management: pain level controlled Vital Signs Assessment: post-procedure vital signs reviewed and stable Respiratory status: spontaneous breathing, nonlabored ventilation, respiratory function stable and patient connected to nasal cannula oxygen Cardiovascular status: blood pressure returned to baseline and stable Postop Assessment: no apparent nausea or vomiting Anesthetic complications: no  No notable events documented.  Last Vitals:  Vitals:   03/19/23 1320 03/19/23 1330  BP: 131/76 131/81  Pulse: (!) 111 99  Resp: (!) 26 15  Temp:  36.7 C  SpO2: 93% 93%    Last Pain:  Vitals:   03/19/23 1330  TempSrc: Temporal  PainSc: 0-No pain                 Mignon Bechler S

## 2023-03-20 ENCOUNTER — Encounter (HOSPITAL_COMMUNITY): Payer: Self-pay | Admitting: Cardiology

## 2023-03-28 ENCOUNTER — Ambulatory Visit
Admission: RE | Admit: 2023-03-28 | Discharge: 2023-03-28 | Disposition: A | Discharge status: Home / Self Care | Payer: 59 | Source: Ambulatory Visit | Attending: Physician Assistant | Admitting: Physician Assistant

## 2023-03-28 DIAGNOSIS — S2242XA Multiple fractures of ribs, left side, initial encounter for closed fracture: Secondary | ICD-10-CM | POA: Diagnosis not present

## 2023-03-28 DIAGNOSIS — J939 Pneumothorax, unspecified: Secondary | ICD-10-CM

## 2023-03-28 DIAGNOSIS — J90 Pleural effusion, not elsewhere classified: Secondary | ICD-10-CM | POA: Diagnosis not present

## 2023-04-02 NOTE — Progress Notes (Deleted)
abcd

## 2023-04-03 ENCOUNTER — Encounter: Payer: Self-pay | Admitting: Cardiology

## 2023-04-03 ENCOUNTER — Ambulatory Visit: Payer: 59 | Attending: Cardiology | Admitting: Cardiology

## 2023-04-03 VITALS — BP 112/68 | HR 80 | Resp 16 | Ht 71.0 in | Wt 134.6 lb

## 2023-04-03 DIAGNOSIS — F17200 Nicotine dependence, unspecified, uncomplicated: Secondary | ICD-10-CM

## 2023-04-03 DIAGNOSIS — I4819 Other persistent atrial fibrillation: Secondary | ICD-10-CM | POA: Diagnosis not present

## 2023-04-03 NOTE — Patient Instructions (Signed)
Medication Instructions:  Your physician recommends that you continue on your current medications as directed. Please refer to the Current Medication list given to you today.  *If you need a refill on your cardiac medications before your next appointment, please call your pharmacy*   Lab Work: none If you have labs (blood work) drawn today and your tests are completely normal, you will receive your results only by: MyChart Message (if you have MyChart) OR A paper copy in the mail If you have any lab test that is abnormal or we need to change your treatment, we will call you to review the results.   Testing/Procedures: Your physician has recommended that you have a Cardioversion (DCCV). Electrical Cardioversion uses a jolt of electricity to your heart either through paddles or wired patches attached to your chest. This is a controlled, usually prescheduled, procedure. Defibrillation is done under light anesthesia in the hospital, and you usually go home the day of the procedure. This is done to get your heart back into a normal rhythm. You are not awake for the procedure. Please see the instruction sheet given to you today. Scheduled for October 4   Follow-Up: At [facility], you and your health needs are our priority.  As part of our continuing mission to provide you with exceptional heart care, we have created designated Provider Care Teams.  These Care Teams include your primary Cardiologist (physician) and Advanced Practice Providers (APPs -  Physician Assistants and Nurse Practitioners) who all work together to provide you with the care you need, when you need it.  We recommend signing up for the patient portal called "MyChart".  Sign up information is provided on this After Visit Summary.  MyChart is used to connect with patients for Virtual Visits (Telemedicine).  Patients are able to view lab/test results, encounter notes, upcoming appointments, etc.  Non-urgent messages can be  sent to your provider as well.   To learn more about what you can do with MyChart, go to [email or web address].    Your next appointment:   6 week(s)  Provider:   Afib clinic If Card or EP not listed click to update   DO NOT delete brackets or number around this link :1}   Other Instructions     Dear Kristopher [name]  You are scheduled for a Cardioversion on Friday, October 4 with Dr. Patrecia Pour arrive at the [facility] (Main Entrance A) at [facility]: [address] [place], [place] [address] at 12:00 PM (This time is 1 hour(s) before your procedure to ensure your preparation). Free valet parking service is available. You will check in at ADMITTING. The support person will be asked to wait in the waiting room.  It is OK to have someone drop you off and come back when you are ready to be discharged.      DIET:  Nothing to eat or drink after midnight except a sip of water with medications (see medication instructions below)  MEDICATION INSTRUCTIONS: !!IF ANY NEW MEDICATIONS ARE STARTED AFTER TODAY, PLEASE NOTIFY YOUR PROVIDER AS SOON AS POSSIBLE!!  FYI: Medications such as Semaglutide (Ozempic, [place]), Tirzepatide (Mounjaro, Zepbound), Dulaglutide (Trulicity), etc ("GLP1 agonists") AND Canagliflozin (Invokana), Dapagliflozin (Farxiga), Empagliflozin (Jardiance), Ertugliflozin (Steglatro), Bexagliflozin [employer]) or any combination with one of these drugs such as Invokamet (Canagliflozin/Metformin), Synjardy (Empagliflozin/Metformin), etc ("SGLT2 inhibitors") must be held around the time of a procedure. This is not a comprehensive list of all of these drugs. Please review all of your medications  and talk to your provider if you take any one of these. If you are not sure, ask your provider.     Continue taking your anticoagulant (blood thinner): Apixaban (Eliquis).  You will need to continue this after your procedure until you are told by your provider that it is safe to stop.     LABS: done in hospital  FYI:  For your safety, and to allow [place] to monitor your vital signs accurately during the surgery/procedure we request: If you have artificial nails, gel coating, SNS etc, please have those removed prior to your surgery/procedure. Not having the nail coverings /polish removed may result in cancellation or delay of your surgery/procedure.  You must have a responsible person to drive you home and stay in the waiting area during your procedure. Failure to do so could result in cancellation.  Bring your insurance cards.  *Special Note: Every effort is made to have your procedure done on time. Occasionally there are emergencies that occur at the hospital that may cause delays. Please be patient if a delay does occur.    AFIB CLINIC INFORMATION:  The AFib Clinic is located on the [facility] at [address]  Patients should enter through Entrance "A" of [facility] and go to [facility] (immediately to the left of the entrance) to the 6th floor. Once you exit the elevator on the 6th floor check in with the Afib Clinic Registration Desk to the right of elevator. Valet parking is available at the entrance. Phone number: [phone]

## 2023-04-03 NOTE — H&P (View-Only) (Signed)
Cardiology Office Note:  .   Date:  04/04/2023  ID:  Kristopher [name], DOB 1960-11-04, MRN [id number] PCP: Patient, No Pcp Per  [facility] Providers Cardiologist:  Yates Decamp, MD    History of Present Illness: .   Kristopher [name] is a 62 y.o.  male with medical history significant of cigarette smoking presented to the ED on 03/14/2023 after a fall from a ladder which was approximately 6 feet above the ground.  Patient complained of left-sided rib pain and left hip pain.   He has been diagnosed with left 7 through 9 rib fractures and small left pneumothorax.  In the ED he was found to be in A-fib with RVR and was started on diltiazem drip.  Since he has been in persistent atrial fibrillation with RVR 2 days later, I was consulted to further manage and evaluate.  He underwent TEE guided direct-current cardioversion on 03/16/2023 and due to recurrence had repeat cardioversion on 03/19/2023 but unfortunately did not sustain sinus rhythm.  He was discharged home on amiodarone and recommended outpatient follow-up.  Except for chest pain due to rib fracture and fatigue since hospital discharge he has no other specific complaints.  He has completely remained abstinent from tobacco.  Review of Systems  Constitutional: Positive for malaise/fatigue.  Cardiovascular:  Negative for chest pain, dyspnea on exertion and leg swelling.    Physical Exam Neck:     Vascular: No carotid bruit or JVD.  Cardiovascular:     Rate and Rhythm: Normal rate. Rhythm irregular.     Pulses: Normal pulses and intact distal pulses.     Heart sounds: No murmur heard. Pulmonary:     Effort: Pulmonary effort is normal.     Breath sounds: Normal breath sounds.  Abdominal:     General: Bowel sounds are normal.     Palpations: Abdomen is soft.  Musculoskeletal:     Right lower leg: No edema.     Left lower leg: No edema.  Skin:    Capillary Refill: Capillary refill takes less than 2 seconds.      Risk  Assessment/Calculations:    CHA2DS2-VASc Score = 1   This indicates a 0.6% annual risk of stroke. The patient's score is based upon: CHF History: 0 HTN History: 0 Diabetes History: 0 Stroke History: 0 Vascular Disease History: 1 Age Score: 0 Gender Score: 0              Lab Results  Component Value Date   CHOL 164 03/16/2023   HDL 90 03/16/2023   LDLCALC 68 03/16/2023   TRIG 32 03/16/2023   CHOLHDL 1.8 03/16/2023   Lab Results  Component Value Date   NA 138 03/19/2023   K 3.9 03/19/2023   CO2 26 03/19/2023   GLUCOSE 99 03/19/2023   BUN 11 03/19/2023   CREATININE 0.90 03/19/2023   CALCIUM 8.7 (L) 03/19/2023   GFRNONAA >60 03/19/2023   Lab Results  Component Value Date   WBC 11.7 (H) 03/19/2023   HGB 14.1 03/19/2023   HCT 41.5 03/19/2023   MCV 104.3 (H) 03/19/2023   PLT 251 03/19/2023    Physical Exam:   VS:  BP 112/68 (BP Location: Left Arm, Patient Position: Sitting, Cuff Size: Normal)   Pulse 80   Resp 16   Ht 5\' 11"  (1.803 m)   Wt 134 lb 9.6 oz (61.1 kg)   SpO2 98%   BMI 18.77 kg/m    Wt Readings from Last 3  Encounters:  04/03/23 134 lb 9.6 oz (61.1 kg)  03/14/23 137 lb (62.1 kg)  07/29/16 139 lb 6.4 oz (63.2 kg)     Neck:     Vascular: No carotid bruit or JVD.  Pulmonary:     Effort: Pulmonary effort is normal.     Breath sounds: Normal breath sounds.  Cardiovascular:     Normal rate. Irregular rhythm.  Pulses:    Normal pulses and intact distal pulses.  Edema:    Pretibial: no edema of the left pretibial area and no edema of the right pretibial area. Abdominal:     General: Bowel sounds are normal.     Palpations: Abdomen is soft.  Skin:    Capillary Refill: Capillary refill takes less than 2 seconds.      Studies Reviewed: Marland Kitchen   EKG Interpretation Date/Time:  Tuesday April 03 2023 11:50:54 EDT Ventricular Rate:  80 PR Interval:    QRS Duration:  94 QT Interval:  378 QTC Calculation: 435 R Axis:   60  Text  Interpretation: EKG 04/03/2023: Atrial fibrillation with controlled ventricular response at the rate of 80 bpm, incomplete right bundle branch block.  Nonspecific T abnormality.  No significant change compared to 03/18/2023 Confirmed by Delrae Rend ([id number]) on 04/03/2023 12:03:49 PM    TEE 03/16/2023: 1. Left ventricular ejection fraction, by estimation, is 60 to 65%. The left ventricle has normal function. The left ventricle has no regional wall motion abnormalities. 2. Right ventricular systolic function is normal. The right ventricular size is normal. 3. No left atrial/left atrial appendage thrombus was detected. The LAA emptying velocity was 26 cm/s. 4. The mitral valve is normal in structure. Mild mitral valve regurgitation. No evidence of mitral stenosis. 5. The aortic valve is normal in structure. Aortic valve regurgitation is not visualized. No aortic stenosis is present. 6. The inferior vena cava is normal in size with greater than 50% respiratory variability, suggesting right atrial pressure of 3 mmHg.   Conclusion(s)/Recommendation(s): Normal biventricular function without evidence of hemodynamically significant valvular heart disease.  EKG 03/18/2023: Atrial fibrillation with rapid ventricular sponsor rate of 103 bpm, incomplete right bundle branch block.  Nonspecific T abnormality.  ASSESSMENT AND PLAN: .      ICD-10-CM   1. Persistent atrial fibrillation (HCC)  I48.19 EKG 12-Lead    2. Tobacco use disorder  F17.200      CHA2DS2-VASc Score = 1 [CHF History: 0, HTN History: 0, Diabetes History: 0, Stroke History: 0, Vascular Disease History: 1, Age Score: 0, Gender Score: 0].  Therefore, the patient's annual risk of stroke is 0.6 %.      1. Persistent atrial fibrillation (HCC) Patient in persistent atrial fibrillation, he has been on amiodarone for 3 weeks now and hence would recommend attempting to cardiovert again.  He failed cardioversion twice and did not sustain sinus rhythm  although cardioversion itself was successful.  If he maintains sinus rhythm, due to low CHA2DS2-VASc score, he can come off of anticoagulation after 6 to 8 weeks after cardioversion.  We can also discontinue amiodarone if he maintains sinus rhythm.  He has symptomatic atrial fibrillation and in view of this, if he fails cardioversion, we may have to consider either EP evaluation and/or consideration for different antiarrhythmic therapy including Tikosyn.  I will set him up to be seen in the A-fib clinic in about 6 weeks for management of the same and if he maintains sinus rhythm, he can be seen on a as needed basis.  With regard to cardiac risk stratification, patient does heavy household activities and heavy work without any chest pain or dyspnea.  CT scan of the chest did not reveal any coronary or aortic atherosclerosis.  Also his lipids are completely normal and he has no history of hypertension or diabetes mellitus as well.  Hence no cardiac restratification is indicated for now.  2. Tobacco use disorder He has completely quit smoking since hospitalization and I congratulated him and encouraged him to be abstinent.     Informed Consent   Shared Decision Making/Informed Consent The risks (stroke, cardiac arrhythmias rarely resulting in the need for a temporary or permanent pacemaker, skin irritation or burns and complications associated with conscious sedation including aspiration, arrhythmia, respiratory failure and death), benefits (restoration of normal sinus rhythm) and alternatives of a direct current cardioversion were explained in detail to Mr. [name] and he agrees to proceed.       Dispo: Follow-up A-fib clinic and see me back on a as needed basis  Signed,  Yates Decamp, MD, [facility] 04/04/2023, 7:10 AM

## 2023-04-03 NOTE — Progress Notes (Unsigned)
Cardiology Office Note:  .   Date:  04/04/2023  ID:  Kristopher [name], DOB 1960-11-04, MRN [id number] PCP: Patient, No Pcp Per  [facility] Providers Cardiologist:  Yates Decamp, MD    History of Present Illness: .   Kristopher [name] is a 62 y.o.  male with medical history significant of cigarette smoking presented to the ED on 03/14/2023 after a fall from a ladder which was approximately 6 feet above the ground.  Patient complained of left-sided rib pain and left hip pain.   He has been diagnosed with left 7 through 9 rib fractures and small left pneumothorax.  In the ED he was found to be in A-fib with RVR and was started on diltiazem drip.  Since he has been in persistent atrial fibrillation with RVR 2 days later, I was consulted to further manage and evaluate.  He underwent TEE guided direct-current cardioversion on 03/16/2023 and due to recurrence had repeat cardioversion on 03/19/2023 but unfortunately did not sustain sinus rhythm.  He was discharged home on amiodarone and recommended outpatient follow-up.  Except for chest pain due to rib fracture and fatigue since hospital discharge he has no other specific complaints.  He has completely remained abstinent from tobacco.  Review of Systems  Constitutional: Positive for malaise/fatigue.  Cardiovascular:  Negative for chest pain, dyspnea on exertion and leg swelling.    Physical Exam Neck:     Vascular: No carotid bruit or JVD.  Cardiovascular:     Rate and Rhythm: Normal rate. Rhythm irregular.     Pulses: Normal pulses and intact distal pulses.     Heart sounds: No murmur heard. Pulmonary:     Effort: Pulmonary effort is normal.     Breath sounds: Normal breath sounds.  Abdominal:     General: Bowel sounds are normal.     Palpations: Abdomen is soft.  Musculoskeletal:     Right lower leg: No edema.     Left lower leg: No edema.  Skin:    Capillary Refill: Capillary refill takes less than 2 seconds.      Risk  Assessment/Calculations:    CHA2DS2-VASc Score = 1   This indicates a 0.6% annual risk of stroke. The patient's score is based upon: CHF History: 0 HTN History: 0 Diabetes History: 0 Stroke History: 0 Vascular Disease History: 1 Age Score: 0 Gender Score: 0              Lab Results  Component Value Date   CHOL 164 03/16/2023   HDL 90 03/16/2023   LDLCALC 68 03/16/2023   TRIG 32 03/16/2023   CHOLHDL 1.8 03/16/2023   Lab Results  Component Value Date   NA 138 03/19/2023   K 3.9 03/19/2023   CO2 26 03/19/2023   GLUCOSE 99 03/19/2023   BUN 11 03/19/2023   CREATININE 0.90 03/19/2023   CALCIUM 8.7 (L) 03/19/2023   GFRNONAA >60 03/19/2023   Lab Results  Component Value Date   WBC 11.7 (H) 03/19/2023   HGB 14.1 03/19/2023   HCT 41.5 03/19/2023   MCV 104.3 (H) 03/19/2023   PLT 251 03/19/2023    Physical Exam:   VS:  BP 112/68 (BP Location: Left Arm, Patient Position: Sitting, Cuff Size: Normal)   Pulse 80   Resp 16   Ht 5\' 11"  (1.803 m)   Wt 134 lb 9.6 oz (61.1 kg)   SpO2 98%   BMI 18.77 kg/m    Wt Readings from Last 3  Encounters:  04/03/23 134 lb 9.6 oz (61.1 kg)  03/14/23 137 lb (62.1 kg)  07/29/16 139 lb 6.4 oz (63.2 kg)     Neck:     Vascular: No carotid bruit or JVD.  Pulmonary:     Effort: Pulmonary effort is normal.     Breath sounds: Normal breath sounds.  Cardiovascular:     Normal rate. Irregular rhythm.  Pulses:    Normal pulses and intact distal pulses.  Edema:    Pretibial: no edema of the left pretibial area and no edema of the right pretibial area. Abdominal:     General: Bowel sounds are normal.     Palpations: Abdomen is soft.  Skin:    Capillary Refill: Capillary refill takes less than 2 seconds.      Studies Reviewed: Marland Kitchen   EKG Interpretation Date/Time:  Tuesday April 03 2023 11:50:54 EDT Ventricular Rate:  80 PR Interval:    QRS Duration:  94 QT Interval:  378 QTC Calculation: 435 R Axis:   60  Text  Interpretation: EKG 04/03/2023: Atrial fibrillation with controlled ventricular response at the rate of 80 bpm, incomplete right bundle branch block.  Nonspecific T abnormality.  No significant change compared to 03/18/2023 Confirmed by Delrae Rend ([id number]) on 04/03/2023 12:03:49 PM    TEE 03/16/2023: 1. Left ventricular ejection fraction, by estimation, is 60 to 65%. The left ventricle has normal function. The left ventricle has no regional wall motion abnormalities. 2. Right ventricular systolic function is normal. The right ventricular size is normal. 3. No left atrial/left atrial appendage thrombus was detected. The LAA emptying velocity was 26 cm/s. 4. The mitral valve is normal in structure. Mild mitral valve regurgitation. No evidence of mitral stenosis. 5. The aortic valve is normal in structure. Aortic valve regurgitation is not visualized. No aortic stenosis is present. 6. The inferior vena cava is normal in size with greater than 50% respiratory variability, suggesting right atrial pressure of 3 mmHg.   Conclusion(s)/Recommendation(s): Normal biventricular function without evidence of hemodynamically significant valvular heart disease.  EKG 03/18/2023: Atrial fibrillation with rapid ventricular sponsor rate of 103 bpm, incomplete right bundle branch block.  Nonspecific T abnormality.  ASSESSMENT AND PLAN: .      ICD-10-CM   1. Persistent atrial fibrillation (HCC)  I48.19 EKG 12-Lead    2. Tobacco use disorder  F17.200      CHA2DS2-VASc Score = 1 [CHF History: 0, HTN History: 0, Diabetes History: 0, Stroke History: 0, Vascular Disease History: 1, Age Score: 0, Gender Score: 0].  Therefore, the patient's annual risk of stroke is 0.6 %.      1. Persistent atrial fibrillation (HCC) Patient in persistent atrial fibrillation, he has been on amiodarone for 3 weeks now and hence would recommend attempting to cardiovert again.  He failed cardioversion twice and did not sustain sinus rhythm  although cardioversion itself was successful.  If he maintains sinus rhythm, due to low CHA2DS2-VASc score, he can come off of anticoagulation after 6 to 8 weeks after cardioversion.  We can also discontinue amiodarone if he maintains sinus rhythm.  He has symptomatic atrial fibrillation and in view of this, if he fails cardioversion, we may have to consider either EP evaluation and/or consideration for different antiarrhythmic therapy including Tikosyn.  I will set him up to be seen in the A-fib clinic in about 6 weeks for management of the same and if he maintains sinus rhythm, he can be seen on a as needed basis.  With regard to cardiac risk stratification, patient does heavy household activities and heavy work without any chest pain or dyspnea.  CT scan of the chest did not reveal any coronary or aortic atherosclerosis.  Also his lipids are completely normal and he has no history of hypertension or diabetes mellitus as well.  Hence no cardiac restratification is indicated for now.  2. Tobacco use disorder He has completely quit smoking since hospitalization and I congratulated him and encouraged him to be abstinent.     Informed Consent   Shared Decision Making/Informed Consent The risks (stroke, cardiac arrhythmias rarely resulting in the need for a temporary or permanent pacemaker, skin irritation or burns and complications associated with conscious sedation including aspiration, arrhythmia, respiratory failure and death), benefits (restoration of normal sinus rhythm) and alternatives of a direct current cardioversion were explained in detail to Mr. Bahl and he agrees to proceed.       Dispo: Follow-up A-fib clinic and see me back on a as needed basis  Signed,  Yates Decamp, MD, [facility] 04/04/2023, 7:10 AM

## 2023-04-05 NOTE — Progress Notes (Signed)
Message left for patient, Procedure scheduled for 1315, Please arrive at the hospital at 1200, NPO after midnight on Thursday, May take meds with sips of water in the AM, please have transportation for home post procedure, and someone to stay with pt for approximately 24 hours after  Pt informed to call doctor's office or call [phone] if he has missed any doses of Eliquis or has not been taking regularly

## 2023-04-06 ENCOUNTER — Ambulatory Visit (HOSPITAL_COMMUNITY)
Admission: RE | Admit: 2023-04-06 | Discharge: 2023-04-06 | Disposition: A | Discharge status: Home / Self Care | Payer: 59 | Attending: Cardiology | Admitting: Cardiology

## 2023-04-06 ENCOUNTER — Encounter (HOSPITAL_COMMUNITY)
Admission: RE | Disposition: A | Discharge status: Home / Self Care | Payer: Self-pay | Source: Home / Self Care | Attending: Cardiology

## 2023-04-06 ENCOUNTER — Ambulatory Visit (HOSPITAL_BASED_OUTPATIENT_CLINIC_OR_DEPARTMENT_OTHER): Payer: 59 | Admitting: Anesthesiology

## 2023-04-06 ENCOUNTER — Other Ambulatory Visit: Payer: Self-pay

## 2023-04-06 ENCOUNTER — Ambulatory Visit (HOSPITAL_COMMUNITY): Payer: 59 | Admitting: Anesthesiology

## 2023-04-06 DIAGNOSIS — I4891 Unspecified atrial fibrillation: Secondary | ICD-10-CM | POA: Diagnosis not present

## 2023-04-06 DIAGNOSIS — Z7901 Long term (current) use of anticoagulants: Secondary | ICD-10-CM | POA: Diagnosis not present

## 2023-04-06 DIAGNOSIS — I4819 Other persistent atrial fibrillation: Secondary | ICD-10-CM | POA: Diagnosis not present

## 2023-04-06 DIAGNOSIS — F17200 Nicotine dependence, unspecified, uncomplicated: Secondary | ICD-10-CM | POA: Diagnosis not present

## 2023-04-06 HISTORY — PX: CARDIOVERSION: SHX1299

## 2023-04-06 SURGERY — CARDIOVERSION
Anesthesia: General

## 2023-04-06 MED ORDER — PROPOFOL 10 MG/ML IV BOLUS
INTRAVENOUS | Status: DC | PRN
  Administered 2023-04-06: 70 mg via INTRAVENOUS
  Administered 2023-04-06: 10 mg via INTRAVENOUS
  Administered 2023-04-06: 30 mg via INTRAVENOUS
  Administered 2023-04-06: 20 mg via INTRAVENOUS

## 2023-04-06 MED ORDER — SODIUM CHLORIDE 0.9 % IV SOLN: INTRAVENOUS | Status: DC

## 2023-04-06 SURGICAL SUPPLY — 1 items: PAD DEFIB RADIO PHYSIO CONN (PAD) ×1 IMPLANT

## 2023-04-06 NOTE — Transfer of Care (Signed)
Immediate Anesthesia Transfer of Care Note  Patient: Kristopher [name]  Procedure(s) Performed: CARDIOVERSION  Patient Location: Cath Lab  Anesthesia Type:General  Level of Consciousness: awake, alert , and oriented  Airway & Oxygen Therapy: Patient Spontanous Breathing  Post-op Assessment: Report given to RN  Post vital signs: Reviewed and stable  Last Vitals:  Vitals Value Taken Time  BP    Temp    Pulse    Resp    SpO2      Last Pain:  Vitals:   04/06/23 1223  TempSrc:   PainSc: 0-No pain         Complications: No notable events documented.

## 2023-04-06 NOTE — Discharge Instructions (Signed)
 Electrical Cardioversion Electrical cardioversion is the delivery of a jolt of electricity to restore a normal rhythm to the heart. A rhythm that is too fast or is not regular (arrhythmia) keeps the heart from pumping blood well. There is also another type of cardioversion called a chemical (pharmacologic) cardioversion. This is when your health care provider gives you one or more medicines to bring back your regular heart rhythm. Electrical cardioversion is done as a scheduled procedure for arrhythmiasthat are not life-threatening. Electrical cardioversion may also be done in an emergency for sudden life-threatening arrhythmias. Tell a health care provider about: Any allergies you have. All medicines you are taking, including vitamins, herbs, eye drops, creams, and over-the-counter medicines. Any problems you or family members have had with sedatives or anesthesia. Any bleeding problems you have. Any surgeries you have had, including a pacemaker, defibrillator, or other implanted device. Any medical conditions you have. Whether you are pregnant or may be pregnant. What are the risks? Your provider will talk with you about risks. These include: Allergic reactions to medicines. Irritation to the skin on your chest or back where the sticky pads (electrodes) or paddles were put during electrical cardioversion. A blood clot that breaks free and travels to other parts of your body, such as your brain. Return of a worse abnormal heart rhythm that will need to be treated with medicines, a pacemaker, or an implantable cardioverter defibrillator (ICD). What happens before the procedure? Medicines Your provider may give you: Blood-thinning medicines (anticoagulants) so your blood does not clot as easily. If your provider gives you this medicine, you may need to take it for 4 weeks before the procedure. Medicines to help stabilize your heart rate and rhythm. Ask your provider about: Changing or stopping  your regular medicines. These include any diabetes medicines or blood thinners you take. Taking medicines such as aspirin and ibuprofen. These medicines can thin your blood. Do not take them unless your provider tells you to. Taking over-the-counter medicines, vitamins, herbs, and supplements. General instructions Follow instructions from your provider about what you may eat and drink. Do not put any lotions, powders, or ointments on your chest and back for 24 hours before the procedure. They can cause problems with the electrodes or paddles used to deliver electricity to your heart. Do not wear jewelry as this can interfere with delivering electricity to your heart. If you will be going home right after the procedure, plan to have a responsible adult: Take you home from the hospital or clinic. You will not be allowed to drive. Care for you for the time you are told. Tests You may have an exam or testing. This may include: Blood labs. A transesophageal echocardiogram (TEE). What happens during the procedure?     An IV will be inserted into one of your veins. You will be given a sedative. This helps you relax. Electrodes or metal paddles will be placed on your chest. They may be placed in one of these ways: One placed on your right chest, the other on the left ribs. One placed on your chest and the other on your back. An electrical shock will be delivered. The shock briefly stops (resets) your heart rhythm. Your provider will check to see if your heart rhythm is now normal. Some people need only one shock. Some need more to restore a normal heart rhythm. The procedure may vary among providers and hospitals. What happens after the procedure? Your blood pressure, heart rate, breathing rate, and blood oxygen  level will be monitored until you leave the hospital or clinic. Your heart rhythm will be watched to make sure it does not change. This information is not intended to replace advice  given to you by your health care provider. Make sure you discuss any questions you have with your health care provider. Document Revised: 02/09/2022 Document Reviewed: 02/09/2022 Elsevier Patient Education  2024 [employer].

## 2023-04-06 NOTE — Interval H&P Note (Signed)
History and Physical Interval Note:  04/06/2023 12:38 PM  Kristopher [name]  has presented today for surgery, with the diagnosis of AFIB.  The various methods of treatment have been discussed with the patient and family. After consideration of risks, benefits and other options for treatment, the patient has consented to  Procedure(s): CARDIOVERSION (N/A) as a surgical intervention.  The patient's history has been reviewed, patient examined, no change in status, stable for surgery.  I have reviewed the patient's chart and labs.  Questions were answered to the patient's satisfaction.    Has been on anticoagulation without interruption.   Risks, benefits, and alternatives of direct current cardioversion reviewed with the and patient. Risk includes but not limited to: potential for post-cardioversion rhythms, life-threatening arrhythmias (ventricular tachycardia and fibrillation, profound bradycardia, cardiac arrest), myocardial damage, acute pulmonary edema, skin burns, transient hypotension. Benefits include restoration of sinus rhythm. Alternatives to treatment were discussed, questions were answered, patient voices understanding and provides verbal feedback.  Patient is willing to proceed.   Tessa Lerner, DO, [facility]  [facility]  [address] [place], [place] [address] [phone] 12:39 PM

## 2023-04-06 NOTE — CV Procedure (Signed)
   DIRECT CURRENT CARDIOVERSION  NAME:  Kristopher [name]    MRN: [id number] DOB:  [birth year]    ADMIT DATE: 04/06/2023  Indication:  Symptomatic atrial fibrillation  Procedure Note:  The patient signed informed consent.  They have had had therapeutic anticoagulation with Eliquis greater than 3 weeks.  Anesthesia was administered by Dr. Renold Don.  Adequate airway was maintained throughout and vital followed per protocol.  They were cardioverted x 3 with 200J of biphasic synchronized energy.  He did not convert to sinus rhythm.   There were no apparent complications.  The patient had normal neuro status and respiratory status post procedure with vitals stable as recorded elsewhere.    Follow up: They will continue on current medical therapy and follow up with cardiology as scheduled. Will update his primary cardiologist (Dr. Jacinto Halim) to consider referral to Afib Clinic for EP evaluation.   Emphasized the importance of continuing oral anticoagulation since he is s/p cardioversion.  Patient did not want me to update his wife post procedure.   Tessa Lerner, DO, [facility]  [facility]  [address] [place], [place] [address] [phone] 1:36 PM

## 2023-04-06 NOTE — Anesthesia Preprocedure Evaluation (Signed)
Anesthesia Evaluation  Patient identified by MRN, date of birth, ID band Patient awake    Reviewed: Allergy & Precautions, H&P , NPO status , Patient's Chart, lab work & pertinent test results  Airway Mallampati: II  TM Distance: >3 FB Neck ROM: Full    Dental  (+) Dental Advisory Given, Teeth Intact   Pulmonary former smoker   Pulmonary exam normal breath sounds clear to auscultation       Cardiovascular + dysrhythmias Atrial Fibrillation + Valvular Problems/Murmurs MR  Rhythm:Irregular Rate:Normal + Systolic murmurs TEE 03/16/2023 1. Left ventricular ejection fraction, by estimation, is 60 to 65%. The left ventricle has normal function. The left ventricle has no regional wall motion abnormalities.   2. Right ventricular systolic function is normal. The right ventricular size is normal.   3. No left atrial/left atrial appendage thrombus was detected. The LAA emptying velocity was 26 cm/s.   4. The mitral valve is normal in structure. Mild mitral valve regurgitation. No evidence of mitral stenosis.   5. The aortic valve is normal in structure. Aortic valve regurgitation is not visualized. No aortic stenosis is present.   6. The inferior vena cava is normal in size with greater than 50% respiratory variability, suggesting right atrial pressure of 3 mmHg.   Conclusion(s)/Recommendation(s): Normal biventricular function without evidence of hemodynamically significant valvular heart disease.        Neuro/Psych negative neurological ROS  negative psych ROS   GI/Hepatic negative GI ROS, Neg liver ROS,,,  Endo/Other  negative endocrine ROS    Renal/GU negative Renal ROS  negative genitourinary   Musculoskeletal negative musculoskeletal ROS (+)    Abdominal   Peds negative pediatric ROS (+)  Hematology negative hematology ROS (+)   Anesthesia Other Findings   Reproductive/Obstetrics negative OB ROS                              Anesthesia Physical Anesthesia Plan  ASA: 3  Anesthesia Plan: General   Post-op Pain Management: Minimal or no pain anticipated   Induction: Intravenous  PONV Risk Score and Plan: 1 and Treatment may vary due to age or medical condition, Propofol infusion and TIVA  Airway Management Planned: Mask and Nasal Cannula  Additional Equipment:   Intra-op Plan:   Post-operative Plan:   Informed Consent: I have reviewed the patients History and Physical, chart, labs and discussed the procedure including the risks, benefits and alternatives for the proposed anesthesia with the patient or authorized representative who has indicated his/her understanding and acceptance.     Dental advisory given  Plan Discussed with: CRNA  Anesthesia Plan Comments:        Anesthesia Quick Evaluation

## 2023-04-06 NOTE — Anesthesia Postprocedure Evaluation (Signed)
Anesthesia Post Note  Patient: Kristopher [name]  Procedure(s) Performed: CARDIOVERSION     Patient location during evaluation: Cath Lab Anesthesia Type: General Level of consciousness: sedated and patient cooperative Pain management: pain level controlled Vital Signs Assessment: post-procedure vital signs reviewed and stable Respiratory status: spontaneous breathing Cardiovascular status: stable Anesthetic complications: no   No notable events documented.  Last Vitals:  Vitals:   04/06/23 1359 04/06/23 1400  BP: 131/83 131/83  Pulse: 79 67  Resp: 18 16  Temp: (!) 36.4 C   SpO2: 96% 95%    Last Pain:  Vitals:   04/06/23 1359  TempSrc: Temporal  PainSc: 0-No pain                 Lewie Loron

## 2023-04-09 ENCOUNTER — Encounter (HOSPITAL_COMMUNITY): Payer: Self-pay | Admitting: Cardiology

## 2023-04-09 ENCOUNTER — Telehealth: Payer: Self-pay | Admitting: Physician Assistant

## 2023-04-09 NOTE — Telephone Encounter (Signed)
Attempted to patient in regards to chest x-ray done 03/28/23 for follow up after left rib fractures with pneumothorax. CXR remains unread but on my review appears he has likely moderate left pleural effusion. I tried the phone number listed for patient as well as patient's wife and both went directly to voicemail. Left message for patient to call [facility] office at [phone] to try to assess if symptomatic from this. Would recommend repeat CXR since it has now been almost 2 weeks. If persistent effusion noted on CXR may need chest CT vs thoracentesis.   Juliet Rude, [facility] 04/09/2023, 3:41 PM Please see Amion for pager number during day hours 7:00am-4:30pm

## 2023-04-13 ENCOUNTER — Other Ambulatory Visit (HOSPITAL_COMMUNITY): Payer: Self-pay

## 2023-04-13 ENCOUNTER — Other Ambulatory Visit: Payer: Self-pay

## 2023-04-16 ENCOUNTER — Other Ambulatory Visit (HOSPITAL_COMMUNITY): Payer: Self-pay

## 2023-05-15 ENCOUNTER — Ambulatory Visit (HOSPITAL_COMMUNITY)
Admission: RE | Admit: 2023-05-15 | Discharge: 2023-05-15 | Disposition: A | Discharge status: Home / Self Care | Payer: 59 | Source: Ambulatory Visit | Attending: Internal Medicine | Admitting: Internal Medicine

## 2023-05-15 VITALS — BP 114/66 | HR 65 | Ht 71.0 in | Wt 138.8 lb

## 2023-05-15 DIAGNOSIS — I700 Atherosclerosis of aorta: Secondary | ICD-10-CM | POA: Insufficient documentation

## 2023-05-15 DIAGNOSIS — I4819 Other persistent atrial fibrillation: Secondary | ICD-10-CM | POA: Insufficient documentation

## 2023-05-15 DIAGNOSIS — Z7901 Long term (current) use of anticoagulants: Secondary | ICD-10-CM | POA: Insufficient documentation

## 2023-05-15 DIAGNOSIS — I517 Cardiomegaly: Secondary | ICD-10-CM | POA: Insufficient documentation

## 2023-05-15 DIAGNOSIS — Z79899 Other long term (current) drug therapy: Secondary | ICD-10-CM | POA: Diagnosis not present

## 2023-05-15 NOTE — Progress Notes (Signed)
Primary Care Physician: Patient, No Pcp Per Primary Cardiologist: Yates Decamp, MD Electrophysiologist: None     Referring Physician: Dr. Johny Shock D [name] is a 62 y.o. male with a history of tobacco use, aortic atherosclerosis, and persistent atrial fibrillation who presents for consultation in the [facility]. Recent hospital admission 9/11-16/24 for fall from ladder found to be in Afib with RVR. He underwent successful DCCV on 9/13 but had ERAF noted on 9/15; amiodarone infusion started and underwent repeat DCCV on 9/16 which was not successful. He saw Dr. Jacinto Halim on 10/1 still in Afib and underwent DCCV on 10/4 which was unsuccessful. Patient is on Eliquis for a CHADS2VASC score of 1.  On evaluation today, he is currently in rate controlled Afib. Patient notes to feel intermittently queasy when he bends over and to have intermittent tinnitus. He notes to feel very tired.    Today, he denies symptoms of palpitations, chest pain, shortness of breath, orthopnea, PND, lower extremity edema, dizziness, presyncope, syncope, snoring, daytime somnolence, bleeding, or neurologic sequela. The patient is tolerating medications without difficulties and is otherwise without complaint today.   he has a BMI of Body mass index is 19.36 kg/m.Marland Kitchen Filed Weights   05/15/23 0842  Weight: 63 kg    Current Outpatient Medications  Medication Sig Dispense Refill   amiodarone (PACERONE) 200 MG tablet Take 1 tablet (200 mg) twice daily for 7 days. Then switch to taking 1 tablet (200 mg) once daily (Patient taking differently: Take 200 mg by mouth daily.) 60 tablet 2   apixaban (ELIQUIS) 5 MG TABS tablet Take 1 tablet (5 mg total) by mouth 2 (two) times daily. 60 tablet 2   diltiazem (CARDIZEM CD) 180 MG 24 hr capsule Take 1 capsule (180 mg total) by mouth daily. 30 capsule 2   metoprolol tartrate (LOPRESSOR) 50 MG tablet Take 1 tablet (50 mg total) by mouth 2 (two) times daily.  60 tablet 2   nicotine polacrilex (COMMIT) 2 MG lozenge Take 2 mg by mouth as needed for smoking cessation.     No current facility-administered medications for this encounter.    Atrial Fibrillation Management history:  Previous antiarrhythmic drugs: amiodarone Previous cardioversions: 03/16/23, 03/19/23, 04/06/23 Previous ablations: none Anticoagulation history: Eliquis   ROS- All systems are reviewed and negative except as per the HPI above.  Physical Exam: BP 114/66   Pulse 65   Ht 5\' 11"  (1.803 m)   Wt 63 kg   BMI 19.36 kg/m   GEN: Well nourished, well developed in no acute distress NECK: No JVD; No carotid bruits CARDIAC: Irregularly irregular rate and rhythm, no murmurs, rubs, gallops RESPIRATORY:  Clear to auscultation without rales, wheezing or rhonchi  ABDOMEN: Soft, non-tender, non-distended EXTREMITIES:  No edema; No deformity   EKG today demonstrates  Vent. rate 65 BPM PR interval * ms QRS duration 88 ms QT/QTcB 442/459 ms P-R-T axes * 50 48 Atrial fibrillation Septal infarct , age undetermined Abnormal ECG When compared with ECG of 03-Apr-2023 11:50, PREVIOUS ECG IS PRESENT  Echo 03/15/23 demonstrated  1. Left ventricular ejection fraction, by estimation, is 55 to 60%. The  left ventricle has normal function. The left ventricle has no regional  wall motion abnormalities. Left ventricular diastolic function could not  be evaluated.   2. Right ventricular systolic function is normal. The right ventricular  size is normal. There is mildly elevated pulmonary artery systolic  pressure.   3. Left  atrial size was moderately dilated.   4. Right atrial size was moderately dilated.   5. The mitral valve is normal in structure. Mild to moderate mitral valve  regurgitation. No evidence of mitral stenosis.   6. The aortic valve is normal in structure. Aortic valve regurgitation is  not visualized. No aortic stenosis is present.   7. The inferior vena cava is  dilated in size with <50% respiratory  variability, suggesting right atrial pressure of 15 mmHg.   ASSESSMENT & PLAN CHA2DS2-VASc Score = 1  The patient's score is based upon: CHF History: 0 HTN History: 0 Diabetes History: 0 Stroke History: 0 Vascular Disease History: 1 Age Score: 0 Gender Score: 0       ASSESSMENT AND PLAN: Persistent Atrial Fibrillation (ICD10:  I48.19) The patient's CHA2DS2-VASc score is 1, indicating a 0.6% annual risk of stroke.    He is in Afib. S/p 3 cardioversions, one with ERAF and the other two both unsuccessful on amiodarone. We discussed at this time to continue amiodarone and Eliquis. We will lower Lopressor to 25 mg BID for several days. If no relief of symptoms, can go back to Lopressor 50 mg BID and then lower amiodarone to 100 mg once daily to determine if this helps with his queasiness / tinnitus. Regarding his rhythm control options, I explained to patient he may have had Afib longer than previously noted. I am unsure if at this point he would be considered to be in permanent Afib / rate controlled Afib or if he would benefit from a final attempt with 360J defibrillator, or if this is even worth an attempt considering 2 previous failed cardioversions. I would like him to speak with EP to go over his possible options and if ablation can even be considered in this case. He has moderate bi-atrial enlargement.     Follow up with EP.    Lake Bells, PA-C  Af[facility] [address] [place], [place] [address] [phone]

## 2023-05-15 NOTE — Patient Instructions (Signed)
Trial decreasing metoprolol to 1/2 tablet twice a day for a week if symptoms improve continue  If symptoms do not improve go back to normal dosing of metoprolol and decrease amiodarone to 1/2 tablet daily Please update office with response

## 2023-06-12 ENCOUNTER — Encounter: Payer: Self-pay | Admitting: Cardiology

## 2023-06-13 ENCOUNTER — Other Ambulatory Visit (HOSPITAL_COMMUNITY): Payer: Self-pay

## 2023-06-13 MED ORDER — DILTIAZEM HCL ER COATED BEADS 180 MG PO CP24
180.0000 mg | ORAL_CAPSULE | Freq: Every day | ORAL | 3 refills | Status: DC
  Filled 2023-06-13: qty 30, 30d supply, fill #0
  Filled 2023-07-02 – 2023-07-09 (×3): qty 30, 30d supply, fill #1
  Filled 2023-08-06: qty 30, 30d supply, fill #2
  Filled 2023-09-13: qty 30, 30d supply, fill #3
  Filled 2023-10-09: qty 30, 30d supply, fill #4
  Filled 2023-11-12: qty 30, 30d supply, fill #5
  Filled 2023-12-10: qty 30, 30d supply, fill #6
  Filled 2024-01-07: qty 30, 30d supply, fill #7
  Filled 2024-02-08: qty 30, 30d supply, fill #8
  Filled 2024-03-13: qty 30, 30d supply, fill #9
  Filled 2024-04-08: qty 30, 30d supply, fill #10
  Filled ????-??-??: fill #6

## 2023-06-13 MED ORDER — APIXABAN 5 MG PO TABS
5.0000 mg | ORAL_TABLET | Freq: Two times a day (BID) | ORAL | 3 refills | Status: DC
  Filled 2023-06-13: qty 60, 30d supply, fill #0
  Filled 2023-07-02 – 2023-07-09 (×3): qty 60, 30d supply, fill #1
  Filled 2023-08-06: qty 60, 30d supply, fill #2
  Filled 2023-09-13: qty 60, 30d supply, fill #3
  Filled 2023-10-09: qty 60, 30d supply, fill #4
  Filled 2023-11-12: qty 60, 30d supply, fill #5
  Filled 2023-12-10: qty 60, 30d supply, fill #6
  Filled 2024-01-07: qty 60, 30d supply, fill #7
  Filled 2024-02-08: qty 60, 30d supply, fill #8
  Filled 2024-03-13: qty 60, 30d supply, fill #9
  Filled 2024-04-08: qty 60, 30d supply, fill #10
  Filled ????-??-??: fill #6

## 2023-06-20 NOTE — Progress Notes (Unsigned)
Electrophysiology Office Note:    Date:  06/21/2023   ID:  Kristopher [name], DOB May 26, 1961, MRN [id number]  [facility] Cardiologist:  Yates Decamp, MD  [facility] Electrophysiologist:  Lanier Prude, MD   Referring MD: Eustace Pen, PA-C   Chief Complaint: Atrial fibrillation  History of Present Illness:    Mr. Kristopher [name] is a 62 year old man who I am seeing today for an evaluation of atrial fibrillation at the request of Charlann Lange, PA-C.  The patient last saw Jonny Ruiz in clinic May 15, 2023.  The patient has a history of tobacco use, aortic atherosclerosis and persistent atrial fibrillation.  He was diagnosed with atrial fibrillation with rapid ventricular rates when he was hospitalized in September of this year. Cardioversion on 13 September was noted to return to atrial fibrillation on 15 September.  He was started on amiodarone and Eliquis.  Repeat cardioversions were unsuccessful.  When he saw Jonny Ruiz in clinic in November he was in rate controlled atrial fibrillation.  He reported tinnitus and queasy when bending over.  He is also very tired while in atrial fibrillation.  Review of cardioversion procedure notes from April 06, 2023 showed that 3 200 J shocks were delivered to the heart but none of them resulted in conversion to sinus rhythm.  It appears that he had no sinus rhythm beats.  Today he confirms the above history.  He reports daily fatigue.  He reports the fatigue corresponds to the timeline of his atrial fibrillation.      Their past medical, social and family history was reveiwed.   ROS:   Please see the history of present illness.    All other systems reviewed and are negative.  EKGs/Labs/Other Studies Reviewed:    The following studies were reviewed today:  March 16, 2023 transesophageal echo EF 60 RV normal No left atrial appendage thrombus  March 15, 2023 echo EF 55-60 Moderately dilated left atrium Moderately dilated right  atrium Mild to moderate MR  May 15, 2023 EKG shows atrial fibrillation with a ventricular rate of 65 bpm      Physical Exam:    VS:  BP 110/62 (BP Location: Left Arm, Patient Position: Sitting)   Pulse 65   Ht 5\' 11"  (1.803 m)   Wt 139 lb 9.6 oz (63.3 kg)   SpO2 96%   BMI 19.47 kg/m     Wt Readings from Last 3 Encounters:  06/21/23 139 lb 9.6 oz (63.3 kg)  05/15/23 138 lb 12.8 oz (63 kg)  04/06/23 140 lb (63.5 kg)     GEN: no distress CARD: Irregularly irregular, No MRG RESP: No IWOB. CTAB.        ASSESSMENT AND PLAN:    1. Persistent atrial fibrillation (HCC)   2. Encounter for long-term (current) use of high-risk medication     #Persistent atrial fibrillation May be longstanding persistent or even permanent.  I would like to confirm that he will return in sinus rhythm now that he has been adequately loaded on amiodarone.  This will need to be performed with 360 J defibrillator.  Will get this scheduled for him.  If he is able to maintain sinus rhythm after the cardioversion, could consider catheter ablation for more durable rhythm control.  For now, continue Eliquis The cardioversion procedure including its risks were discussed in detail during today's visit and the patient wishes to proceed.  #High risk med monitoring-amiodarone Update CM P, TSH and free T4  Follow-up in 6 to 8  weeks with me.  If he maintains sinus rhythm and is feeling better normal rhythm, plan to schedule catheter ablation.   Signed, Rossie Muskrat. Lalla Brothers, MD, [facility], [facility] 06/21/2023 8:04 PM    Electrophysiology [facility]

## 2023-06-20 NOTE — H&P (View-Only) (Signed)
Electrophysiology Office Note:    Date:  06/21/2023   ID:  Rickard Wickers [place], DOB 01-19-1961, MRN [id number]  [facility] Cardiologist:  Yates Decamp, MD  [facility] Electrophysiologist:  Lanier Prude, MD   Referring MD: Eustace Pen, PA-C   Chief Complaint: Atrial fibrillation  History of Present Illness:    Mr. [name] is a 62 year old man who I am seeing today for an evaluation of atrial fibrillation at the request of Charlann Lange, PA-C.  The patient last saw Jonny Ruiz in clinic May 15, 2023.  The patient has a history of tobacco use, aortic atherosclerosis and persistent atrial fibrillation.  He was diagnosed with atrial fibrillation with rapid ventricular rates when he was hospitalized in September of this year. Cardioversion on 13 September was noted to return to atrial fibrillation on 15 September.  He was started on amiodarone and Eliquis.  Repeat cardioversions were unsuccessful.  When he saw Jonny Ruiz in clinic in November he was in rate controlled atrial fibrillation.  He reported tinnitus and queasy when bending over.  He is also very tired while in atrial fibrillation.  Review of cardioversion procedure notes from April 06, 2023 showed that 3 200 J shocks were delivered to the heart but none of them resulted in conversion to sinus rhythm.  It appears that he had no sinus rhythm beats.  Today he confirms the above history.  He reports daily fatigue.  He reports the fatigue corresponds to the timeline of his atrial fibrillation.      Their past medical, social and family history was reveiwed.   ROS:   Please see the history of present illness.    All other systems reviewed and are negative.  EKGs/Labs/Other Studies Reviewed:    The following studies were reviewed today:  March 16, 2023 transesophageal echo EF 60 RV normal No left atrial appendage thrombus  March 15, 2023 echo EF 55-60 Moderately dilated left atrium Moderately dilated right  atrium Mild to moderate MR  May 15, 2023 EKG shows atrial fibrillation with a ventricular rate of 65 bpm      Physical Exam:    VS:  BP 110/62 (BP Location: Left Arm, Patient Position: Sitting)   Pulse 65   Ht 5\' 11"  (1.803 m)   Wt 139 lb 9.6 oz (63.3 kg)   SpO2 96%   BMI 19.47 kg/m     Wt Readings from Last 3 Encounters:  06/21/23 139 lb 9.6 oz (63.3 kg)  05/15/23 138 lb 12.8 oz (63 kg)  04/06/23 140 lb (63.5 kg)     GEN: no distress CARD: Irregularly irregular, No MRG RESP: No IWOB. CTAB.        ASSESSMENT AND PLAN:    1. Persistent atrial fibrillation (HCC)   2. Encounter for long-term (current) use of high-risk medication     #Persistent atrial fibrillation May be longstanding persistent or even permanent.  I would like to confirm that he will return in sinus rhythm now that he has been adequately loaded on amiodarone.  This will need to be performed with 360 J defibrillator.  Will get this scheduled for him.  If he is able to maintain sinus rhythm after the cardioversion, could consider catheter ablation for more durable rhythm control.  For now, continue Eliquis The cardioversion procedure including its risks were discussed in detail during today's visit and the patient wishes to proceed.  #High risk med monitoring-amiodarone Update CM P, TSH and free T4  Follow-up in 6 to 8  weeks with me.  If he maintains sinus rhythm and is feeling better normal rhythm, plan to schedule catheter ablation.   Signed, Rossie Muskrat. Lalla Brothers, MD, [facility], [facility] 06/21/2023 8:04 PM    Electrophysiology [facility]

## 2023-06-21 ENCOUNTER — Encounter: Payer: Self-pay | Admitting: Cardiology

## 2023-06-21 ENCOUNTER — Ambulatory Visit: Payer: 59 | Attending: Cardiology | Admitting: Cardiology

## 2023-06-21 VITALS — BP 110/62 | HR 65 | Ht 71.0 in | Wt 139.6 lb

## 2023-06-21 DIAGNOSIS — Z79899 Other long term (current) drug therapy: Secondary | ICD-10-CM

## 2023-06-21 DIAGNOSIS — I4819 Other persistent atrial fibrillation: Secondary | ICD-10-CM | POA: Diagnosis not present

## 2023-06-21 NOTE — Patient Instructions (Signed)
Medication Instructions:  Your physician recommends that you continue on your current medications as directed. Please refer to the Current Medication list given to you today.  *If you need a refill on your cardiac medications before your next appointment, please call your pharmacy*  Lab Work: BMET and CBC  Testing/Procedures: Your physician has recommended that you have a Cardioversion (DCCV). Electrical Cardioversion uses a jolt of electricity to your heart either through paddles or wired patches attached to your chest. This is a controlled, usually prescheduled, procedure. Defibrillation is done under light anesthesia in the hospital, and you usually go home the day of the procedure. This is done to get your heart back into a normal rhythm. You are not awake for the procedure. Please see the instruction sheet given to you today.  Follow-Up: At [facility], you and your health needs are our priority.  As part of our continuing mission to provide you with exceptional heart care, we have created designated Provider Care Teams.  These Care Teams include your primary Cardiologist (physician) and Advanced Practice Providers (APPs -  Physician Assistants and Nurse Practitioners) who all work together to provide you with the care you need, when you need it.  Your next appointment:   6 weeks  Provider:   Steffanie Dunn, MD

## 2023-06-22 LAB — CBC
Hematocrit: 45.6 % (ref 37.5–51.0)
Hemoglobin: 15.5 g/dL (ref 13.0–17.7)
MCH: 33.5 pg — ABNORMAL HIGH (ref 26.6–33.0)
MCHC: 34 g/dL (ref 31.5–35.7)
MCV: 99 fL — ABNORMAL HIGH (ref 79–97)
Platelets: 435 10*3/uL (ref 150–450)
RBC: 4.62 x10E6/uL (ref 4.14–5.80)
RDW: 11.8 % (ref 11.6–15.4)
WBC: 10.2 10*3/uL (ref 3.4–10.8)

## 2023-06-22 LAB — BASIC METABOLIC PANEL
BUN/Creatinine Ratio: 16 (ref 10–24)
BUN: 15 mg/dL (ref 8–27)
CO2: 26 mmol/L (ref 20–29)
Calcium: 10.1 mg/dL (ref 8.6–10.2)
Chloride: 97 mmol/L (ref 96–106)
Creatinine, Ser: 0.96 mg/dL (ref 0.76–1.27)
Glucose: 104 mg/dL — ABNORMAL HIGH (ref 70–99)
Potassium: 4.8 mmol/L (ref 3.5–5.2)
Sodium: 138 mmol/L (ref 134–144)
eGFR: 89 mL/min/{1.73_m2} (ref >59)

## 2023-06-29 NOTE — Pre-Procedure Instructions (Signed)
Attempted to call patient regarding procedure instructions.  Left voicemail on the following:  Arrival time 1200 Nothing to eat or drink after midnight No meds AM of procedure Responsible person to drive you home and stay with you for 24 hrs  Have you missed any doses of anti-coagulant Eliquis- should be taken twice a day,  If you have missed any doses please let us know.  Take your Eliquis on Monday morning.

## 2023-06-30 NOTE — Anesthesia Preprocedure Evaluation (Signed)
Anesthesia Evaluation  Patient identified by MRN, date of birth, ID band Patient awake    Reviewed: Allergy & Precautions, H&P , NPO status , Patient's Chart, lab work & pertinent test results  Airway Mallampati: II  TM Distance: >3 FB Neck ROM: Full    Dental  (+) Dental Advisory Given, Teeth Intact   Pulmonary former smoker   Pulmonary exam normal breath sounds clear to auscultation       Cardiovascular + dysrhythmias Atrial Fibrillation + Valvular Problems/Murmurs MR  Rhythm:Irregular Rate:Normal + Systolic murmurs TEE 03/16/2023 1. Left ventricular ejection fraction, by estimation, is 60 to 65%. The left ventricle has normal function. The left ventricle has no regional wall motion abnormalities.   2. Right ventricular systolic function is normal. The right ventricular size is normal.   3. No left atrial/left atrial appendage thrombus was detected. The LAA emptying velocity was 26 cm/s.   4. The mitral valve is normal in structure. Mild mitral valve regurgitation. No evidence of mitral stenosis.   5. The aortic valve is normal in structure. Aortic valve regurgitation is not visualized. No aortic stenosis is present.   6. The inferior vena cava is normal in size with greater than 50% respiratory variability, suggesting right atrial pressure of 3 mmHg.   Conclusion(s)/Recommendation(s): Normal biventricular function without evidence of hemodynamically significant valvular heart disease.        Neuro/Psych negative neurological ROS  negative psych ROS   GI/Hepatic negative GI ROS, Neg liver ROS,,,  Endo/Other  negative endocrine ROS    Renal/GU negative Renal ROS  negative genitourinary   Musculoskeletal negative musculoskeletal ROS (+)    Abdominal   Peds negative pediatric ROS (+)  Hematology negative hematology ROS (+)   Anesthesia Other Findings   Reproductive/Obstetrics negative OB ROS                              Anesthesia Physical Anesthesia Plan  ASA: 3  Anesthesia Plan: General   Post-op Pain Management: Minimal or no pain anticipated   Induction: Intravenous  PONV Risk Score and Plan: 1 and Treatment may vary due to age or medical condition and Propofol infusion  Airway Management Planned: Mask, Nasal Cannula and Natural Airway  Additional Equipment: None  Intra-op Plan:   Post-operative Plan:   Informed Consent: I have reviewed the patients History and Physical, chart, labs and discussed the procedure including the risks, benefits and alternatives for the proposed anesthesia with the patient or authorized representative who has indicated his/her understanding and acceptance.     Dental advisory given  Plan Discussed with: CRNA and Anesthesiologist  Anesthesia Plan Comments:        Anesthesia Quick Evaluation

## 2023-07-02 ENCOUNTER — Other Ambulatory Visit: Payer: Self-pay

## 2023-07-02 ENCOUNTER — Encounter (HOSPITAL_COMMUNITY)
Admission: RE | Disposition: A | Discharge status: Home / Self Care | Payer: Self-pay | Source: Home / Self Care | Attending: Cardiology

## 2023-07-02 ENCOUNTER — Ambulatory Visit (HOSPITAL_COMMUNITY)
Admission: RE | Admit: 2023-07-02 | Discharge: 2023-07-02 | Disposition: A | Discharge status: Home / Self Care | Payer: 59 | Attending: Cardiology | Admitting: Cardiology

## 2023-07-02 ENCOUNTER — Other Ambulatory Visit (HOSPITAL_COMMUNITY): Payer: Self-pay

## 2023-07-02 ENCOUNTER — Ambulatory Visit (HOSPITAL_COMMUNITY): Payer: Self-pay | Admitting: Anesthesiology

## 2023-07-02 DIAGNOSIS — I4819 Other persistent atrial fibrillation: Secondary | ICD-10-CM | POA: Diagnosis not present

## 2023-07-02 DIAGNOSIS — I700 Atherosclerosis of aorta: Secondary | ICD-10-CM | POA: Insufficient documentation

## 2023-07-02 DIAGNOSIS — Z87891 Personal history of nicotine dependence: Secondary | ICD-10-CM | POA: Insufficient documentation

## 2023-07-02 DIAGNOSIS — Z7901 Long term (current) use of anticoagulants: Secondary | ICD-10-CM | POA: Diagnosis not present

## 2023-07-02 DIAGNOSIS — I4891 Unspecified atrial fibrillation: Secondary | ICD-10-CM | POA: Diagnosis not present

## 2023-07-02 DIAGNOSIS — Z01818 Encounter for other preprocedural examination: Secondary | ICD-10-CM

## 2023-07-02 HISTORY — PX: CARDIOVERSION: EP1203

## 2023-07-02 SURGERY — CARDIOVERSION (CATH LAB)
Anesthesia: General

## 2023-07-02 MED ORDER — PROPOFOL 10 MG/ML IV BOLUS
INTRAVENOUS | Status: DC | PRN
  Administered 2023-07-02: 75 mg via INTRAVENOUS

## 2023-07-02 MED ORDER — SODIUM CHLORIDE 0.9 % IV SOLN: INTRAVENOUS | Status: DC

## 2023-07-02 SURGICAL SUPPLY — 1 items: PAD DEFIB RADIO PHYSIO CONN (PAD) ×1 IMPLANT

## 2023-07-02 NOTE — Anesthesia Postprocedure Evaluation (Signed)
Anesthesia Post Note  Patient: Kristopher [name]  Procedure(s) Performed: CARDIOVERSION     Patient location during evaluation: PACU Anesthesia Type: General Level of consciousness: awake and alert Pain management: pain level controlled Vital Signs Assessment: post-procedure vital signs reviewed and stable Respiratory status: spontaneous breathing, nonlabored ventilation, respiratory function stable and patient connected to nasal cannula oxygen Cardiovascular status: blood pressure returned to baseline and stable Postop Assessment: no apparent nausea or vomiting Anesthetic complications: no   There were no known notable events for this encounter.  Last Vitals: There were no vitals filed for this visit.  Last Pain:  Vitals:   07/02/23 1238  PainSc: 0-No pain                 Takuya Lariccia

## 2023-07-02 NOTE — Transfer of Care (Signed)
Immediate Anesthesia Transfer of Care Note  Patient: Kristopher [name]  Procedure(s) Performed: CARDIOVERSION  Patient Location: PACU  Anesthesia Type:General  Level of Consciousness: awake and alert   Airway & Oxygen Therapy: Patient Spontanous Breathing  Post-op Assessment: Report given to RN  Post vital signs: Reviewed and stable  Last Vitals:  Vitals Value Taken Time  BP    Temp    Pulse    Resp    SpO2      Last Pain:  Vitals:   07/02/23 1238  PainSc: 0-No pain         Complications: There were no known notable events for this encounter.

## 2023-07-02 NOTE — Interval H&P Note (Signed)
History and Physical Interval Note:  07/02/2023 1:59 PM  Kristopher [name]  has presented today for surgery, with the diagnosis of afib.  The various methods of treatment have been discussed with the patient and family. After consideration of risks, benefits and other options for treatment, the patient has consented to  Procedure(s): CARDIOVERSION (N/A) as a surgical intervention.  The patient's history has been reviewed, patient examined, no change in status, stable for surgery.  I have reviewed the patient's chart and labs.  Questions were answered to the patient's satisfaction.     Hadas Jessop T Kaylan Friedmann

## 2023-07-03 ENCOUNTER — Encounter (HOSPITAL_COMMUNITY): Payer: Self-pay | Admitting: Cardiology

## 2023-07-03 ENCOUNTER — Other Ambulatory Visit: Payer: Self-pay

## 2023-07-09 ENCOUNTER — Other Ambulatory Visit (HOSPITAL_COMMUNITY): Payer: Self-pay

## 2023-07-09 ENCOUNTER — Other Ambulatory Visit: Payer: Self-pay

## 2023-07-09 ENCOUNTER — Encounter (HOSPITAL_COMMUNITY): Payer: Self-pay

## 2023-07-10 ENCOUNTER — Other Ambulatory Visit (HOSPITAL_COMMUNITY): Payer: Self-pay

## 2023-07-10 ENCOUNTER — Encounter: Payer: Self-pay | Admitting: Cardiology

## 2023-07-10 ENCOUNTER — Encounter (HOSPITAL_COMMUNITY): Payer: Self-pay

## 2023-07-10 MED ORDER — METOPROLOL TARTRATE 50 MG PO TABS
50.0000 mg | ORAL_TABLET | Freq: Every day | ORAL | 3 refills | Status: DC
  Filled 2023-07-10: qty 30, 30d supply, fill #0
  Filled 2023-08-06: qty 30, 30d supply, fill #1
  Filled 2023-09-13: qty 30, 30d supply, fill #2
  Filled 2023-10-09: qty 30, 30d supply, fill #3
  Filled 2023-11-12: qty 30, 30d supply, fill #4
  Filled 2023-12-10: qty 30, 30d supply, fill #5
  Filled 2024-01-07: qty 30, 30d supply, fill #6
  Filled 2024-02-08: qty 30, 30d supply, fill #7
  Filled 2024-03-13: qty 30, 30d supply, fill #8
  Filled 2024-04-08: qty 30, 30d supply, fill #9
  Filled ????-??-??: fill #5

## 2023-07-11 ENCOUNTER — Other Ambulatory Visit (HOSPITAL_COMMUNITY): Payer: Self-pay

## 2023-08-02 ENCOUNTER — Ambulatory Visit: Payer: 59 | Admitting: Physician Assistant

## 2023-08-07 NOTE — Progress Notes (Signed)
  Electrophysiology Office Follow up Visit Note:    Date:  08/08/2023   ID:  Kristopher [name], DOB Jun 21, 1961, MRN [id number]  PCP:  Patient, No Pcp Per  [facility] Cardiologist:  Gordy Bergamo, MD  [facility] Electrophysiologist:  OLE ONEIDA HOLTS, MD    Interval History:     Kristopher [name] is a 63 y.o. male who presents for a follow up visit.   I last saw the patient 06/21/2023 for persistent AF. He takes amiodarone . He went for DCCV 07/02/2023.  He sent a message in stating he returned to AF on 07/09/2023.  Today he tells me he did not really notice a difference in his symptomatology when he was back in sinus rhythm.  No significant improvement in his energy level, breathing, exercise tolerance.  He is taking his medications without off target effects.  He also tells me that he thinks he may be developing PTSD after his fall from a ladder.  He was previously diagnosed with bipolar and follow-up with psychiatry.  He is open to me referring him to primary care today to reestablish care with psychiatry to have this evaluated/treated.         Past medical, surgical, social and family history were reviewed.  ROS:   Please see the history of present illness.    All other systems reviewed and are negative.  EKGs/Labs/Other Studies Reviewed:    The following studies were reviewed today:  07/02/2023 post DCCV ECG shows sinus, QTc .  03/15/2023 Echo shows EF 55, RV normal, dilated atria, moderate MR       Physical Exam:    VS:  BP 104/70   Pulse 63   Ht 5' 11 (1.803 m)   Wt 150 lb 6.4 oz (68.2 kg)   SpO2 99%   BMI 20.98 kg/m     Wt Readings from Last 3 Encounters:  08/08/23 150 lb 6.4 oz (68.2 kg)  06/21/23 139 lb 9.6 oz (63.3 kg)  05/15/23 138 lb 12.8 oz (63 kg)     GEN: no distress CARD: irregularly irregular, No MRG RESP: No IWOB. CTAB.      ASSESSMENT:    1. Persistent atrial fibrillation (HCC)   2. Encounter for long-term (current) use of  high-risk medication    PLAN:    In order of problems listed above:  #Persistent AF #High risk med monitoring - amiodarone  Has failed amio and DCCV 07/02/2023.  Today, I discussed options including continuing with a rate control strategy, pursuing catheter ablation. After our discussion, the patient and I mutually decided to proceed with a rate control strategy.  We will discontinue amiodarone  today to minimize the chances of off target effects.  His CHA2DS2-VASc is at least 1 for atherosclerosis.  Given the permanent nature of his atrial fibrillation I do think he is at an increased risk of stroke and have recommended continuing anticoagulation.   #Needs primary care Referring to primary care physician.  He wants to discuss possible PTSD diagnosis in addition to establishing for routine preventive care.  Follow-up with EP APP in 6 months.  Signed, Ole Holts, MD, [facility], [facility] 08/08/2023 9:08 AM    Electrophysiology [facility]

## 2023-08-08 ENCOUNTER — Other Ambulatory Visit (HOSPITAL_COMMUNITY): Payer: Self-pay

## 2023-08-08 ENCOUNTER — Ambulatory Visit: Payer: 59 | Attending: Cardiology | Admitting: Cardiology

## 2023-08-08 ENCOUNTER — Encounter: Payer: Self-pay | Admitting: Cardiology

## 2023-08-08 VITALS — BP 104/70 | HR 63 | Ht 71.0 in | Wt 150.4 lb

## 2023-08-08 DIAGNOSIS — Z79899 Other long term (current) drug therapy: Secondary | ICD-10-CM

## 2023-08-08 DIAGNOSIS — I4819 Other persistent atrial fibrillation: Secondary | ICD-10-CM

## 2023-08-08 NOTE — Patient Instructions (Signed)
 Medication Instructions:  Your physician has recommended you make the following change in your medication:  1) STOP taking amiodarone   *If you need a refill on your cardiac medications before your next appointment, please call your pharmacy* Follow-Up: At [facility], you and your health needs are our priority.  As part of our continuing mission to provide you with exceptional heart care, we have created designated Provider Care Teams.  These Care Teams include your primary Cardiologist (physician) and Advanced Practice Providers (APPs -  Physician Assistants and Nurse Practitioners) who all work together to provide you with the care you need, when you need it.  Your next appointment:   6 months  Provider:   You will see one of the following Advanced Practice Providers on your designated Care Team:   Charlies Arthur, PA-C Ozell Jodie Passey, PA-C Suzann Riddle, NP Daphne Barrack, NP  Other Instructions

## 2023-09-13 ENCOUNTER — Other Ambulatory Visit (HOSPITAL_COMMUNITY): Payer: Self-pay

## 2023-10-30 DIAGNOSIS — H25813 Combined forms of age-related cataract, bilateral: Secondary | ICD-10-CM | POA: Diagnosis not present

## 2023-11-15 DIAGNOSIS — H25812 Combined forms of age-related cataract, left eye: Secondary | ICD-10-CM | POA: Diagnosis not present

## 2023-11-15 DIAGNOSIS — Z87891 Personal history of nicotine dependence: Secondary | ICD-10-CM | POA: Diagnosis not present

## 2023-11-15 DIAGNOSIS — I10 Essential (primary) hypertension: Secondary | ICD-10-CM | POA: Diagnosis not present

## 2023-12-06 DIAGNOSIS — H25811 Combined forms of age-related cataract, right eye: Secondary | ICD-10-CM | POA: Diagnosis not present

## 2023-12-10 ENCOUNTER — Encounter (HOSPITAL_COMMUNITY): Payer: Self-pay

## 2023-12-10 ENCOUNTER — Other Ambulatory Visit (HOSPITAL_COMMUNITY): Payer: Self-pay

## 2024-04-30 NOTE — Progress Notes (Unsigned)
 Electrophysiology Office Note:   Date:  05/01/2024  ID:  Gordy Goar Oesterle, DOB April 15, 1961, MRN [id number]  Primary Cardiologist: Gordy Bergamo, MD Primary Heart Failure: None Electrophysiologist: OLE ONEIDA HOLTS, MD      History of Present Illness:   Kristopher [name] is a 63 y.o. male with h/o AF seen today for routine electrophysiology followup.   Since last being seen in our clinic the patient reports he still has not completely recovered from his accident last year where he fell off a ladder (broken ribs etc).  He did quit smoking but continues to use nicotine  lozenges. He reports a fatigue that seems to stay with him but this is not new or different. He is compliant with diltiazem , lopressor  and eliquis .  No bleeding issues. He reports he never has heart racing.  He denies chest pain, palpitations, dyspnea, PND, orthopnea, nausea, vomiting, dizziness, syncope, edema, weight gain, or early satiety.   Review of systems complete and found to be negative unless listed in HPI.   EP Information / Studies Reviewed:    EKG is ordered today. Personal review as below.  EKG Interpretation Date/Time:  Thursday May 01 2024 08:34:53 EDT Ventricular Rate:  78 PR Interval:    QRS Duration:  74 QT Interval:  388 QTC Calculation: 442 R Axis:   85  Text Interpretation: Atrial fibrillation Confirmed by Aniceto Jarvis ([id number]) on 05/01/2024 9:01:54 AM    Arrhythmia / AAD / Pertinent EP Studies AF > initial dx ~ 03/2023  DCCV 04/2023 > 3 200j shocks, no sinus beats  DCCV 07/02/23 > 360j shock x1, converted to NSR    Amiodarone   03/2023 > 08/2023    Risk Assessment/Calculations:    CHA2DS2-VASc Score = 1   This indicates a 0.6% annual risk of stroke. The patient's score is based upon: CHF History: 0 HTN History: 0 Diabetes History: 0 Stroke History: 0 Vascular Disease History: 1 Age Score: 0 Gender Score: 0              Physical Exam:   VS:  BP 96/60   Pulse 78   Ht 5' 11  (1.803 m)   Wt 152 lb (68.9 kg)   SpO2 95%   BMI 21.20 kg/m    Wt Readings from Last 3 Encounters:  05/01/24 152 lb (68.9 kg)  08/08/23 150 lb 6.4 oz (68.2 kg)  06/21/23 139 lb 9.6 oz (63.3 kg)     GEN: Well nourished, well developed in no acute distress NECK: No JVD; No carotid bruits CARDIAC: Regular rate and rhythm, no murmurs, rubs, gallops RESPIRATORY:  Clear to auscultation without rales, wheezing or rhonchi  ABDOMEN: Soft, non-tender, non-distended EXTREMITIES:  No edema; No deformity   ASSESSMENT AND PLAN:    Persistent Atrial Fibrillation  CHA2DS2-VASc 1, previously failed DCCV on amio  -rate control strategy elected after discussion with patient in 08/2023 with Dr. Holts (pt uanble to maintain NSR after 4 DCCV)  -continue OAC for stroke prophylaxis  -continue Cardizem  180 mg daily  -Metoprolol  50 mg BID -EKG with AF, well controlled ventricular rate    Secondary Hypercoagulable State  -continue Eliquis  5mg  BID, dose reviewed and appropriate by age / wt   Former Tobacco Abuse  -quit smoking, uses nicotine  lozenges    Follow up with Dr. Bergamo.  Given rhythm control strategy abandoned, will consolidate his care back to Dr. Bergamo.  Reviewed EP is always available if needed.    Signed, Jarvis Aniceto, NP-C, AGACNP-BC [place]  [facility] - Electrophysiology  05/01/2024, 10:16 AM

## 2024-05-01 ENCOUNTER — Encounter: Payer: Self-pay | Admitting: Pulmonary Disease

## 2024-05-01 ENCOUNTER — Other Ambulatory Visit (HOSPITAL_COMMUNITY): Payer: Self-pay

## 2024-05-01 ENCOUNTER — Ambulatory Visit: Attending: Pulmonary Disease | Admitting: Pulmonary Disease

## 2024-05-01 VITALS — BP 96/60 | HR 78 | Ht 71.0 in | Wt 152.0 lb

## 2024-05-01 DIAGNOSIS — D6869 Other thrombophilia: Secondary | ICD-10-CM

## 2024-05-01 DIAGNOSIS — I4819 Other persistent atrial fibrillation: Secondary | ICD-10-CM

## 2024-05-01 MED ORDER — APIXABAN 5 MG PO TABS
5.0000 mg | ORAL_TABLET | Freq: Two times a day (BID) | ORAL | 3 refills | Status: AC
  Filled 2024-05-01: qty 180, 90d supply, fill #0
  Filled 2024-05-09: qty 60, 30d supply, fill #0
  Filled 2024-06-07: qty 60, 30d supply, fill #1
  Filled 2024-07-08: qty 60, 30d supply, fill #2

## 2024-05-01 MED ORDER — METOPROLOL TARTRATE 50 MG PO TABS
50.0000 mg | ORAL_TABLET | Freq: Every day | ORAL | 3 refills | Status: AC
  Filled 2024-05-01: qty 90, 90d supply, fill #0
  Filled 2024-05-09: qty 30, 30d supply, fill #0
  Filled 2024-06-07: qty 30, 30d supply, fill #1
  Filled 2024-07-08: qty 30, 30d supply, fill #2

## 2024-05-01 MED ORDER — DILTIAZEM HCL ER COATED BEADS 180 MG PO CP24
180.0000 mg | ORAL_CAPSULE | Freq: Every day | ORAL | 3 refills | Status: AC
  Filled 2024-05-01 – 2024-05-09 (×2): qty 30, 30d supply, fill #0
  Filled 2024-06-07: qty 30, 30d supply, fill #1
  Filled 2024-07-08: qty 30, 30d supply, fill #2

## 2024-05-01 NOTE — Patient Instructions (Signed)
 Medication Instructions:  Your physician recommends that you continue on your current medications as directed. Please refer to the Current Medication list given to you today.  *If you need a refill on your cardiac medications before your next appointment, please call your pharmacy*  Lab Work: None ordered If you have labs (blood work) drawn today and your tests are completely normal, you will receive your results only by: MyChart Message (if you have MyChart) OR A paper copy in the mail If you have any lab test that is abnormal or we need to change your treatment, we will call you to review the results.  Follow-Up: At [facility], you and your health needs are our priority.  As part of our continuing mission to provide you with exceptional heart care, our providers are all part of one team.  This team includes your primary Cardiologist (physician) and Advanced Practice Providers or APPs (Physician Assistants and Nurse Practitioners) who all work together to provide you with the care you need, when you need it.  Your next appointment:   1 year(s)  Provider:   Gordy Bergamo, MD

## 2024-05-08 ENCOUNTER — Other Ambulatory Visit (HOSPITAL_COMMUNITY): Payer: Self-pay

## 2024-05-09 ENCOUNTER — Other Ambulatory Visit (HOSPITAL_COMMUNITY): Payer: Self-pay

## 2024-07-02 ENCOUNTER — Other Ambulatory Visit (HOSPITAL_BASED_OUTPATIENT_CLINIC_OR_DEPARTMENT_OTHER): Payer: Self-pay

## 2024-07-08 ENCOUNTER — Other Ambulatory Visit (HOSPITAL_COMMUNITY): Payer: Self-pay

## 2024-07-09 ENCOUNTER — Other Ambulatory Visit (HOSPITAL_COMMUNITY): Payer: Self-pay

## 2024-07-10 ENCOUNTER — Other Ambulatory Visit (HOSPITAL_BASED_OUTPATIENT_CLINIC_OR_DEPARTMENT_OTHER): Payer: Self-pay
